# Patient Record
Sex: Male | Born: 1985 | Race: White | Hispanic: No | Smoking: Former smoker
Health system: Southern US, Community
[De-identification: ages and names within clinical notes are randomized; demographics above are authoritative.]

## PROBLEM LIST (undated history)

## (undated) DIAGNOSIS — E119 Type 2 diabetes mellitus without complications: Secondary | ICD-10-CM

## (undated) HISTORY — PX: TONSILLECTOMY: SUR1361

## (undated) HISTORY — PX: SHOULDER SURGERY: SHX246

---
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
## 2004-12-19 ENCOUNTER — Emergency Department (HOSPITAL_COMMUNITY): Admission: EM | Admit: 2004-12-19 | Discharge: 2004-12-19 | Payer: Self-pay | Admitting: Emergency Medicine

## 2005-06-04 ENCOUNTER — Ambulatory Visit: Payer: Self-pay | Admitting: Internal Medicine

## 2007-03-27 ENCOUNTER — Emergency Department (HOSPITAL_COMMUNITY): Admission: EM | Admit: 2007-03-27 | Discharge: 2007-03-27 | Payer: Self-pay | Admitting: Emergency Medicine

## 2007-03-27 ENCOUNTER — Inpatient Hospital Stay (HOSPITAL_COMMUNITY): Admission: AD | Admit: 2007-03-27 | Discharge: 2007-03-31 | Payer: Self-pay | Admitting: Psychiatry

## 2007-03-28 ENCOUNTER — Ambulatory Visit: Payer: Self-pay | Admitting: Psychiatry

## 2007-04-04 ENCOUNTER — Other Ambulatory Visit (HOSPITAL_COMMUNITY): Admission: RE | Admit: 2007-04-04 | Discharge: 2007-07-03 | Payer: Self-pay | Admitting: Psychiatry

## 2011-07-13 ENCOUNTER — Emergency Department: Payer: Self-pay | Admitting: Unknown Physician Specialty

## 2011-10-01 LAB — URINE DRUGS OF ABUSE SCREEN W ALC, ROUTINE (REF LAB)
Amphetamine Screen, Ur: NEGATIVE
Barbiturate Quant, Ur: NEGATIVE
Benzodiazepines.: NEGATIVE
Cocaine Metabolites: NEGATIVE
Creatinine,U: 176.6
Ethyl Alcohol: <5
Marijuana Metabolite: NEGATIVE
Methadone: NEGATIVE
Opiate Screen, Urine: NEGATIVE
Phencyclidine (PCP): NEGATIVE
Propoxyphene: NEGATIVE

## 2012-11-25 LAB — COMPREHENSIVE METABOLIC PANEL
Albumin: 3.5 g/dL (ref 3.4–5.0)
Alkaline Phosphatase: 79 U/L (ref 50–136)
Anion Gap: 9 (ref 7–16)
BUN: 10 mg/dL (ref 7–18)
Bilirubin,Total: 0.6 mg/dL (ref 0.2–1.0)
Calcium, Total: 8.9 mg/dL (ref 8.5–10.1)
Chloride: 100 mmol/L (ref 98–107)
Co2: 25 mmol/L (ref 21–32)
Creatinine: 0.83 mg/dL (ref 0.60–1.30)
EGFR (African American): >60
EGFR (Non-African Amer.): >60
Glucose: 355 mg/dL — ABNORMAL HIGH (ref 65–99)
Osmolality: 282 (ref 275–301)
Potassium: 3.8 mmol/L (ref 3.5–5.1)
SGOT(AST): 29 U/L (ref 15–37)
SGPT (ALT): 58 U/L (ref 12–78)
Sodium: 134 mmol/L — ABNORMAL LOW (ref 136–145)
Total Protein: 7.9 g/dL (ref 6.4–8.2)

## 2012-11-25 LAB — CBC WITH DIFFERENTIAL/PLATELET
Basophil #: 0.1 10*3/uL (ref 0.0–0.1)
Basophil %: 0.8 %
Eosinophil #: 0.2 10*3/uL (ref 0.0–0.7)
Eosinophil %: 1.9 %
HCT: 44.5 % (ref 40.0–52.0)
HGB: 15.6 g/dL (ref 13.0–18.0)
Lymphocyte #: 2.3 10*3/uL (ref 1.0–3.6)
Lymphocyte %: 24.6 %
MCH: 29.7 pg (ref 26.0–34.0)
MCHC: 35 g/dL (ref 32.0–36.0)
MCV: 85 fL (ref 80–100)
Monocyte #: 0.8 x10 3/mm (ref 0.2–1.0)
Monocyte %: 8.5 %
Neutrophil #: 6.1 10*3/uL (ref 1.4–6.5)
Neutrophil %: 64.2 %
Platelet: 221 10*3/uL (ref 150–440)
RBC: 5.23 10*6/uL (ref 4.40–5.90)
RDW: 12.6 % (ref 11.5–14.5)
WBC: 9.5 10*3/uL (ref 3.8–10.6)

## 2012-11-26 ENCOUNTER — Inpatient Hospital Stay: Payer: Self-pay | Admitting: Internal Medicine

## 2012-11-26 LAB — CBC WITH DIFFERENTIAL/PLATELET
Basophil #: 0 10*3/uL (ref 0.0–0.1)
Basophil %: 0.6 %
Eosinophil #: 0.2 10*3/uL (ref 0.0–0.7)
Eosinophil %: 2.1 %
HCT: 43.9 % (ref 40.0–52.0)
HGB: 15.6 g/dL (ref 13.0–18.0)
Lymphocyte #: 2.1 10*3/uL (ref 1.0–3.6)
Lymphocyte %: 24.3 %
MCH: 30.2 pg (ref 26.0–34.0)
MCHC: 35.5 g/dL (ref 32.0–36.0)
MCV: 85 fL (ref 80–100)
Monocyte #: 0.7 x10 3/mm (ref 0.2–1.0)
Monocyte %: 8.1 %
Neutrophil #: 5.5 10*3/uL (ref 1.4–6.5)
Neutrophil %: 64.9 %
Platelet: 216 10*3/uL (ref 150–440)
RBC: 5.17 10*6/uL (ref 4.40–5.90)
RDW: 12.7 % (ref 11.5–14.5)
WBC: 8.5 10*3/uL (ref 3.8–10.6)

## 2012-11-26 LAB — COMPREHENSIVE METABOLIC PANEL
Albumin: 3.2 g/dL — ABNORMAL LOW (ref 3.4–5.0)
Anion Gap: 7 (ref 7–16)
BUN: 9 mg/dL (ref 7–18)
Chloride: 104 mmol/L (ref 98–107)
Co2: 27 mmol/L (ref 21–32)
EGFR (African American): >60
EGFR (Non-African Amer.): >60
SGOT(AST): 32 U/L (ref 15–37)
SGPT (ALT): 58 U/L (ref 12–78)
Sodium: 138 mmol/L (ref 136–145)

## 2012-11-26 LAB — VANCOMYCIN, TROUGH: Vancomycin, Trough: 15 ug/mL (ref 10–20)

## 2012-11-29 LAB — WOUND CULTURE

## 2012-12-01 LAB — CULTURE, BLOOD (SINGLE)

## 2013-04-13 ENCOUNTER — Emergency Department: Payer: Self-pay | Admitting: Emergency Medicine

## 2013-04-15 ENCOUNTER — Emergency Department: Payer: Self-pay | Admitting: Unknown Physician Specialty

## 2013-04-17 ENCOUNTER — Emergency Department: Payer: Self-pay | Admitting: Emergency Medicine

## 2013-04-25 DIAGNOSIS — IMO0001 Reserved for inherently not codable concepts without codable children: Secondary | ICD-10-CM | POA: Insufficient documentation

## 2013-05-03 ENCOUNTER — Other Ambulatory Visit: Payer: Self-pay | Admitting: Nurse Practitioner

## 2013-05-03 LAB — CBC WITH DIFFERENTIAL/PLATELET
Basophil %: 0.7 %
Eosinophil %: 1.6 %
HCT: 42.8 % (ref 40.0–52.0)
HGB: 15 g/dL (ref 13.0–18.0)
Lymphocyte #: 1.6 10*3/uL (ref 1.0–3.6)
MCH: 29.8 pg (ref 26.0–34.0)
MCV: 85 fL (ref 80–100)
Monocyte #: 0.6 x10 3/mm (ref 0.2–1.0)
Platelet: 203 10*3/uL (ref 150–440)
RBC: 5.04 10*6/uL (ref 4.40–5.90)
RDW: 13.2 % (ref 11.5–14.5)

## 2013-05-03 LAB — COMPREHENSIVE METABOLIC PANEL
Albumin: 3.6 g/dL (ref 3.4–5.0)
Anion Gap: 7 (ref 7–16)
BUN: 9 mg/dL (ref 7–18)
Bilirubin,Total: 0.7 mg/dL (ref 0.2–1.0)
Calcium, Total: 8.7 mg/dL (ref 8.5–10.1)
Chloride: 107 mmol/L (ref 98–107)
Creatinine: 0.73 mg/dL (ref 0.60–1.30)
EGFR (Non-African Amer.): >60
Osmolality: 278 (ref 275–301)
Potassium: 3.8 mmol/L (ref 3.5–5.1)
SGOT(AST): 32 U/L (ref 15–37)
SGPT (ALT): 49 U/L (ref 12–78)
Total Protein: 7.5 g/dL (ref 6.4–8.2)

## 2013-05-03 LAB — URINALYSIS, COMPLETE
Bilirubin,UR: NEGATIVE
Blood: NEGATIVE
Ketone: NEGATIVE
Leukocyte Esterase: NEGATIVE
Ph: 6 (ref 4.5–8.0)
RBC,UR: <1 /HPF (ref 0–5)
Squamous Epithelial: NONE SEEN

## 2013-05-03 LAB — LIPID PANEL
HDL Cholesterol: 33 mg/dL — ABNORMAL LOW (ref 40–60)
Triglycerides: 181 mg/dL (ref 0–200)

## 2013-09-16 ENCOUNTER — Emergency Department: Payer: Self-pay | Admitting: Emergency Medicine

## 2013-11-11 ENCOUNTER — Emergency Department: Payer: Self-pay | Admitting: Emergency Medicine

## 2013-12-18 ENCOUNTER — Ambulatory Visit: Payer: Self-pay | Admitting: Orthopedic Surgery

## 2014-02-27 ENCOUNTER — Ambulatory Visit: Payer: Self-pay | Admitting: Orthopedic Surgery

## 2014-03-26 DIAGNOSIS — S43492A Other sprain of left shoulder joint, initial encounter: Secondary | ICD-10-CM | POA: Insufficient documentation

## 2014-03-26 DIAGNOSIS — M25312 Other instability, left shoulder: Secondary | ICD-10-CM | POA: Insufficient documentation

## 2014-05-03 DIAGNOSIS — E785 Hyperlipidemia, unspecified: Secondary | ICD-10-CM | POA: Insufficient documentation

## 2014-05-03 DIAGNOSIS — R0683 Snoring: Secondary | ICD-10-CM | POA: Insufficient documentation

## 2014-05-21 DIAGNOSIS — Z9889 Other specified postprocedural states: Secondary | ICD-10-CM | POA: Insufficient documentation

## 2015-04-02 NOTE — H&P (Signed)
PATIENT NAME:  Orlena SheldonDREWS, Terald MR#:  [id number] DATE OF BIRTH:  1986/09/16  DATE OF ADMISSION:  11/25/2012  REFERRING PHYSICIAN: Dr. Bayard Malesandolph Brown.  PRIMARY CARE PHYSICIAN: None.   HISTORY OF PRESENT ILLNESS: This is a 29 year old male without significant past medical history who presents with complaints of left abdominal wall cellulitis. The patient reports he has been having this for the last two weeks, but reports over the last week it has been draining purulent discharge. As well, he reports multiple recurrent boils, some of them developed cellulitis and small abscess and resolved spontaneously, mainly in the right axillary region as well. There is a new boil in the right abdominal wall. In the ED the patient was afebrile, did not have any leukocytosis, but the patient complained of chills at home and significant pain. The patient had CT of abdomen and pelvis with IV contrast which did not show any evidence of abscess or fluid collection, but it did show some stranding, subcutaneous fat as well, developing phlegmon in the right abdominal wall. As well, the patient had significantly elevated blood glucose at 355. The patient reports he was diagnosed in the past with diabetes mellitus, for which he was on some oral medications which he stopped a few years ago because he thought he was treated and he does not need to take them anymore. Hospitalist service was requested to admit the patient for further management of his diabetes and cellulitic infection.   PAST MEDICAL HISTORY: Diabetes mellitus.   PAST SURGICAL HISTORY: None.   SOCIAL HISTORY: Denies smoking, drinking or illicit drug use.   FAMILY HISTORY: Significant for diabetes mellitus in the family, mainly his father and grandfather.   ALLERGIES: Codeine.   HOME MEDICATIONS: None.   REVIEW OF SYSTEMS: The patient denies any weight loss or weight gain but complains of fatigue, weakness, and chills. EYES: Denies blurry vision, double vision or  pain. ENT: Denies tinnitus, ear pain or hearing loss. RESPIRATORY: Denies cough, wheezing, hemoptysis. CARDIOVASCULAR: Denies chest pain, orthopnea, edema, arrhythmia, or palpitations. GASTROINTESTINAL: Denies nausea, vomiting, diarrhea, abdominal pain, hematemesis, melena or gastroesophageal reflux disease. GENITOURINARY: Denies dysuria, hematuria, renal colic. ENDOCRINE: Denies polyuria, polydipsia, heat or cold intolerance. HEMATOLOGY: Denies anemia, easy bruising, bleeding diathesis. INTEGUMENTARY: Has complaints of recurrent skin boils with drainage, mainly in the axillary area. MUSCULOSKELETAL: Denies neck pain, shoulder pain, knee pain, arthritis, cramps, gout. NEUROLOGIC: Denies numbness, dysarthria, epilepsy, tremors, vertigo, ataxia. PSYCHIATRIC: Denies anxiety, insomnia, schizophrenia, nervousness, bipolar disorder.   PHYSICAL EXAMINATION:  VITAL SIGNS: Temperature 97.7, pulse 88, respiratory rate 18, blood pressure 134/85, pulse oximetry 94% on room air.   GENERAL: Morbidly obese male who looks comfortable in bed in no apparent distress.   HEENT: Head normocephalic. Pupils equal, reactive to light. Pink conjunctivae. Anicteric sclerae. Moist oral mucosa.   NECK: Supple. No thyromegaly. No JVD.   CHEST: Good air entry bilaterally. No wheezing, rales or rhonchi.   CARDIOVASCULAR: S1, S2 heard. No rubs, murmur, or gallops.   ABDOMEN: Soft, nontender, nondistended. Bowel sounds present.   EXTREMITIES: No edema. No clubbing, no cyanosis.   PSYCHIATRIC: Appropriate affect. Awake, alert x3. Intact judgment and insight.   NEUROLOGIC: Cranial nerves grossly intact to 5/5.   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   LABORATORY, RADIOLOGICAL AND DIAGNOSTIC DATA: Glucose 355, BUN 10, creatinine 0.83,  sodium 134, potassium 3.8, chloride 100,  CO2 25. White blood cells 9.5, hemoglobin 15.6, hematocrit 44.5 and platelets 221. CT of abdomen and pelvis showing focal inflammatory stranding in the subcutaneous fat along the left anterior, lateral abdominal wall which might be infectious or inflammatory. No focal fluid collection is seen and right abdominal wall focal inflammatory stranding which may present infectious or inflammatory origin, possible evolving phlegmon.   ASSESSMENT AND PLAN: 29 year old male who presents with abdominal wall cellulitis with purulent discharge, most likely it was abscess which was spontaneously drained. As well, the patient has history of multiple recurrent skin boils. CT of abdomen does not show any fluid collection.  1. Abdominal wall cellulitis. CT of abdomen does not show any fluid collection. We will start the patient on Vancomycin and Zosyn. We will follow on blood cultures and wound culture. 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 Will consult surgical service for possible need of debridement as the skin looks deep with continuous purulent discharge.  2. Diabetes mellitus, uncontrolled, as the patient was off his medication for a few years. Will be started on insulin sliding scale. We will avoid metformin as he recently received IV contrast.  3. CODE STATUS: FULL CODE.  4. Deep vein thrombosis prophylaxis. Subcutaneous heparin.   TOTAL TIME SPENT ON ADMISSION AND PATIENT CARE: 45 minutes.   ____________________________ Starleen Arms, MD dse:ap D: 11/25/2012 08:39:46 ET T: 11/25/2012 09:04:11 ET JOB#: [id number]  cc: Starleen Arms, MD, <Dictator> DAWOOD Teena Irani MD ELECTRONICALLY SIGNED 11/26/2012 1:48

## 2015-04-02 NOTE — Discharge Summary (Signed)
PATIENT NAME:  Angel SheldonDREWS, Yiannis MR#:  [id number] DATE OF BIRTH:  [birth year]  DATE OF ADMISSION:  11/26/2012 DATE OF DISCHARGE:  11/28/2012  PRIMARY CARE PHYSICIAN:  None.   DISCHARGE DIAGNOSES:  1.  Abdominal wall cellulitis secondary to Staphylococcus.  2.  De novo diabetes mellitus.   CONSULTS:  Dr. Excell Seltzerooper of Surgery.   PROCEDURES:  None.   IMAGING STUDIES:  CT scan of the abdomen and pelvis with contrast showed cellulitis in the abdominal wall with no abscess.   ADMITTING HISTORY AND PHYSICAL:  Please see detailed H and P dictated on 11/25/2012. In brief, a 29 year old obese male patient with prior history of folliculitis on the abdominal wall presented with left abdominal wall cellulitis. The patient had pus draining from his wound, also had fever at home with elevated blood sugars of 355 with new diagnosis of diabetes, and was admitted to the hospitalist service for further workup and treatment.   HOSPITAL COURSE:  1.  Abdominal wall cellulitis. The patient had a CT scan of the abdomen which showed no abscess. Surgery was consulted who suggested no I and D as the patient had a widely open wound which drained with minimal purulent discharge. The patient was started on broad-spectrum antibiotics of vancomycin and Zosyn with which he responded well. The wound cultures have grown Staph aureus which is sensitive to ciprofloxacin which the patient has been transitioned to orally and is being discharged home to follow up with Dr. Excell Seltzerooper of Surgery.  2.  Diabetes. For diabetes, the patient has been started on insulin 70/30 of 10 units b.i.d. and has been given supplies for glucometer, Accustrips and also syringes. [facility]. He has been given lifestyle education regarding diabetes while in the hospital.   DISCHARGE CONDITION:  On the day of discharge, the patient is afebrile. His abdominal wall wound looks significantly better with minimal  erythema around it and is being discharged home in a fair condition.   DISCHARGE MEDICATIONS:  Include: 1.  Metformin 850 mg oral twice a day.  2.  Insulin 70/30 subcutaneous, 10 units twice a day.  3.  Ciprofloxacin 500 mg oral twice a day for 10 days.   DISCHARGE INSTRUCTIONS:  The patient will be on a diabetic diet. Activity as tolerated. Followup with Surgery, Dr. Excell Seltzerooper. He is to call his doctor or return to Emergency Room if he has any fever or worsening of symptoms.   Time spent _____ on this discharge dictation along with coordinating and counseling of the patient was 40 minutes.    ____________________________ Molinda BailiffSrikar R. Zamora Colton, MD srs:es D: 11/28/2012 15:17:35 ET T: 11/29/2012 13:24:26 ET JOB#: [id number]  cc: Wardell HeathSrikar R. Gahel Safley, MD, <Dictator> Orie FishermanSRIKAR R Kaimani Clayson MD ELECTRONICALLY SIGNED 11/29/2012 17:10

## 2015-04-06 NOTE — Op Note (Signed)
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   PROCEDURE: Left shoulder arthroscopy.   SURGEON: Murlean HarkShalini Freddi Forster, M.D.   ASSISTANT: Devota PaceApril Berndt, NP  ANESTHESIA: Interscalene nerve block and general anesthesia.   COMPLICATIONS: Unfortunately, after many attempts to maintain proper visualization in order to perform labral repair, the patient's body habitus was prohibitive of this. With shortest sleeve applied to arthroscope, the joint was unable to be visualized from the posterior aspect, as the camera and sleeve were maximally inserted and unable to fully penetrate into the glenohumeral joint. Therefore, procedure was truncated before repair was possible.   INDICATIONS FOR PROCEDURE: Mr. Angel Chandler is a 29 year old gentleman who had previously been a Stage managerprofessional wrestler. He sustained an injury to the left shoulder resulting in an anterior dislocation. He states that since the time of that trauma, he had had multiple episodes of dislocation without any associated trauma. He explains that he had had many small prior injuries to his shoulder from his wrestling career.   MR arthrogram confirmed a 360-degree labral tear. The decision was made to attempt arthroscopic labral repair. Risks and benefits were explained to the patient in the office. We did explain to the patient that, with his large body habitus, we may be somewhat limited in what equipment would be able to be used. The patient demonstrated full understanding.   DESCRIPTION OF PROCEDURE: The patient was identified in the preoperative holding area. Left upper extremity was marked as the operative site. Interscalene block was administered. The patient was brought into the operating room and placed on the table in a lateral  decubitus position after general anesthesia was administered. Axillary roll was placed. Appropriate padding was placed. The patient was secured on the bed with multiple safety straps and a beanbag. The left upper extremity was placed in a traction set-up to distract the joint. Left upper extremity was prepared and draped in the usual sterile fashion. Landmarks were palpated. A surgical timeout was performed, identifying the patient, procedure, laterality, imaging studies, confirmation of skin preparation, confirmation of preoperative antibiotics, and confirmation of consent form.   Incision for a standard lateral viewing portal was made. Trocar and arthroscope sleeve were inserted. With the sleeve maximally inserted, we were unable to palpably feel entry into the glenohumeral joint. A longer trocar was inserted through the existing sleeve and penetration into the joint was palpably felt. The arm was moved in order to move the humeral head to confirm that we were in the glenohumeral joint.   Trocar was removed and arthroscope was inserted. The joint was very briefly visualized; however, the arthroscope was unable to be advanced completely into the glenohumeral space. A second, more medial, incision was made in order to bypass some of the excessive soft tissue. Unfortunately, on the second incision, similarly, we were unable to make the arthroscope reach into the glenohumeral joint.   The decision was now made to try from the anterior aspect. The coracoid was palpated, and just medial to the coracoid, an incision was made. Arthroscope was able be inserted into the glenohumeral joint through the anterior access portal. The joint was found to have significant tissue fraying. The labrum was found to have extensive degeneration throughout. The glenohumeral joint was noted to have significant degenerative changes. The most pronounced portion of the labral tear was  anterior, and the patient's dislocations were  anterior-inferior.  The decision was made that the most important portion to repair would be the anterior labrum if only   limited repair would be possible with our limited visibility. Attempt was made to insert shaver through the posterior portal; however, through a cannula, the shaver maximally hubbed out without being able to reach the anterior glenoid.   At this time, another attempt was made to achieve access through the posterior aspect. A switching stick was taken from the anterior portal directly through the glenohumeral joint posteriorly and an incision was made directly over the exit point. Arthroscope was again attempted to be inserted; however, once again could not reach into the glenohumeral joint.   At this time, the decision was made to truncate the procedure as the patient's body habitus with a weight of 389 pounds would not allow adequate visualization or performance of labral repair. All instruments were removed. Portals were closed. Sterile dressings were applied.   An extensive discussion was had with the patient and his family regarding our limitations with our shoulder arthroscopy equipment, and given the patient's significant body habitus, we will arrange for the patient to be seen by Dr. Glendell Chandler at [facility]. Dr. Dorita Sciara was contacted and is aware of the complications that we ran into during this procedure. He will see if he will be able to gain access to longer hip arthroscopy equipment potentially to perform the surgery. He will see Angel Chandler in consultation next month.    ____________________________ Murlean Hark, MD sr:np D: 03/05/2014 16:36:02 ET T: 03/05/2014 22:06:52 ET JOB#: [id number]  cc: Murlean Hark, MD, <Dictator> Murlean Hark MD ELECTRONICALLY SIGNED 03/21/2014 11:24

## 2015-10-11 IMAGING — CR DG SHOULDER 3+V*L*
1 series · 2 of 2 positions shown · non-contrast
Comparison: None.

CLINICAL DATA: Left shoulder pain, decreased range of motion

EXAM:
DG SHOULDER 3+VIEWS LEFT

[Series 1: w shoulder grashey left · 0.14mm/px · 2 of 2 slices shown]
[im 1/2]
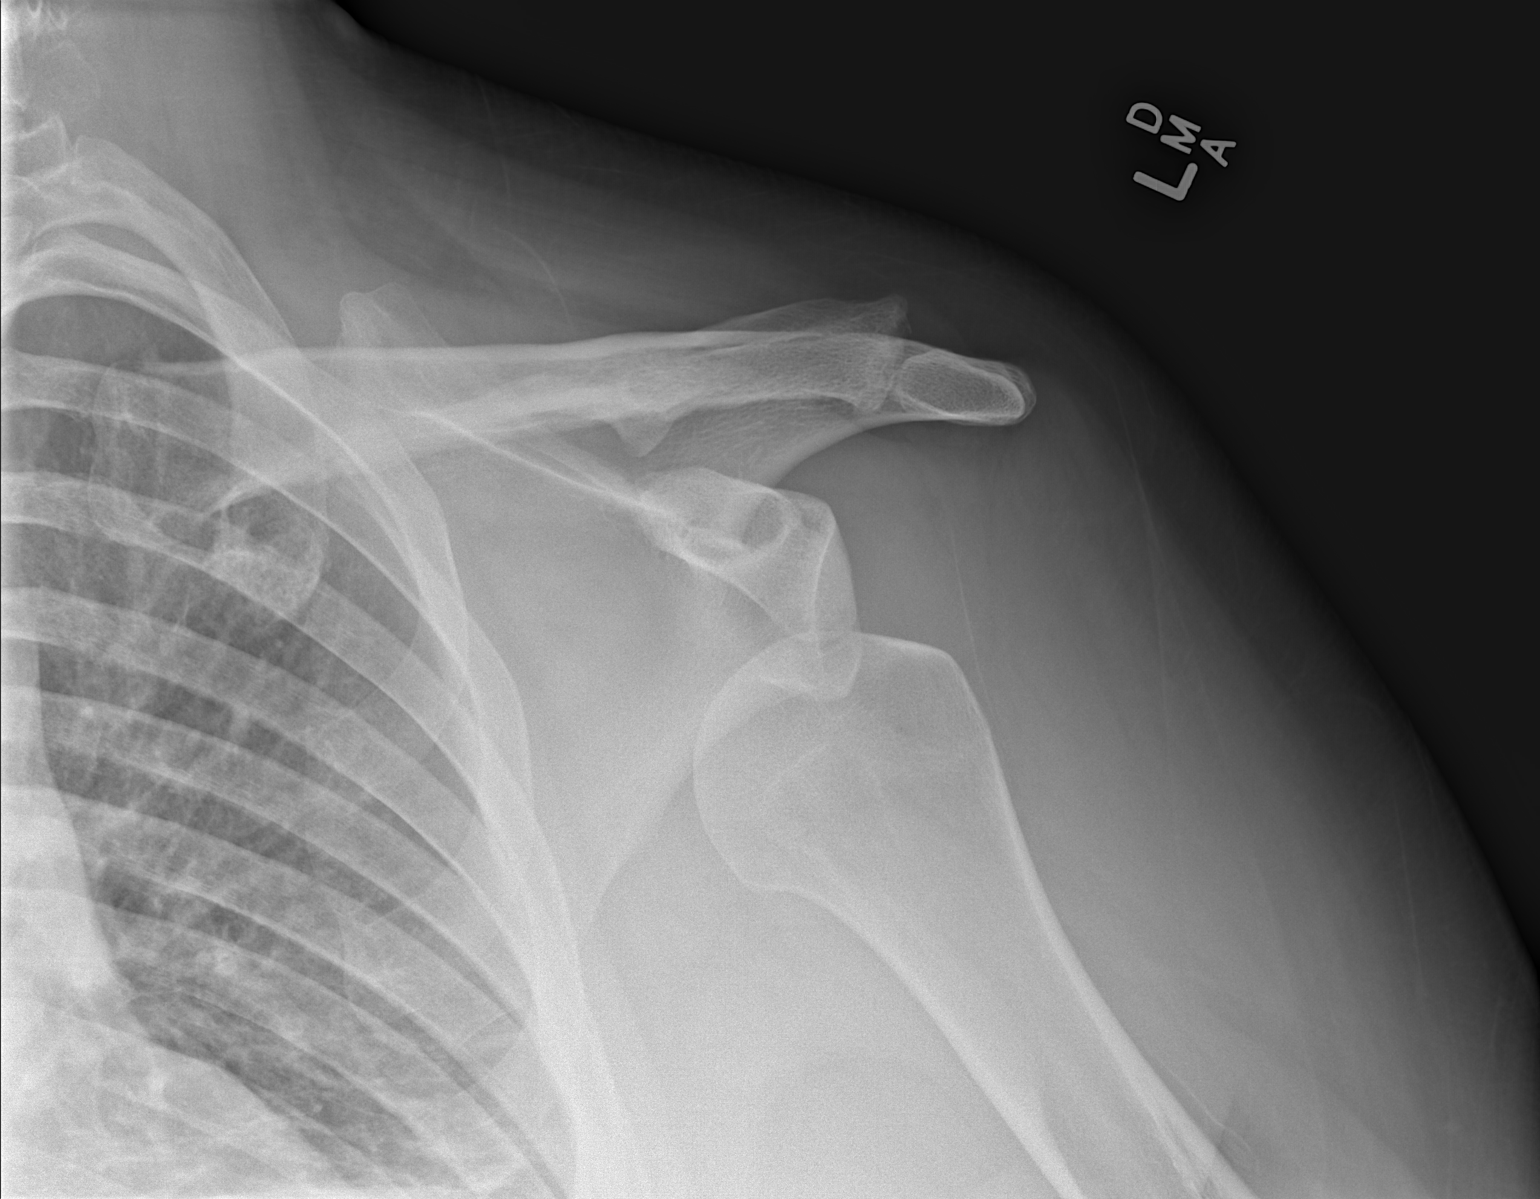
[im 2/2]
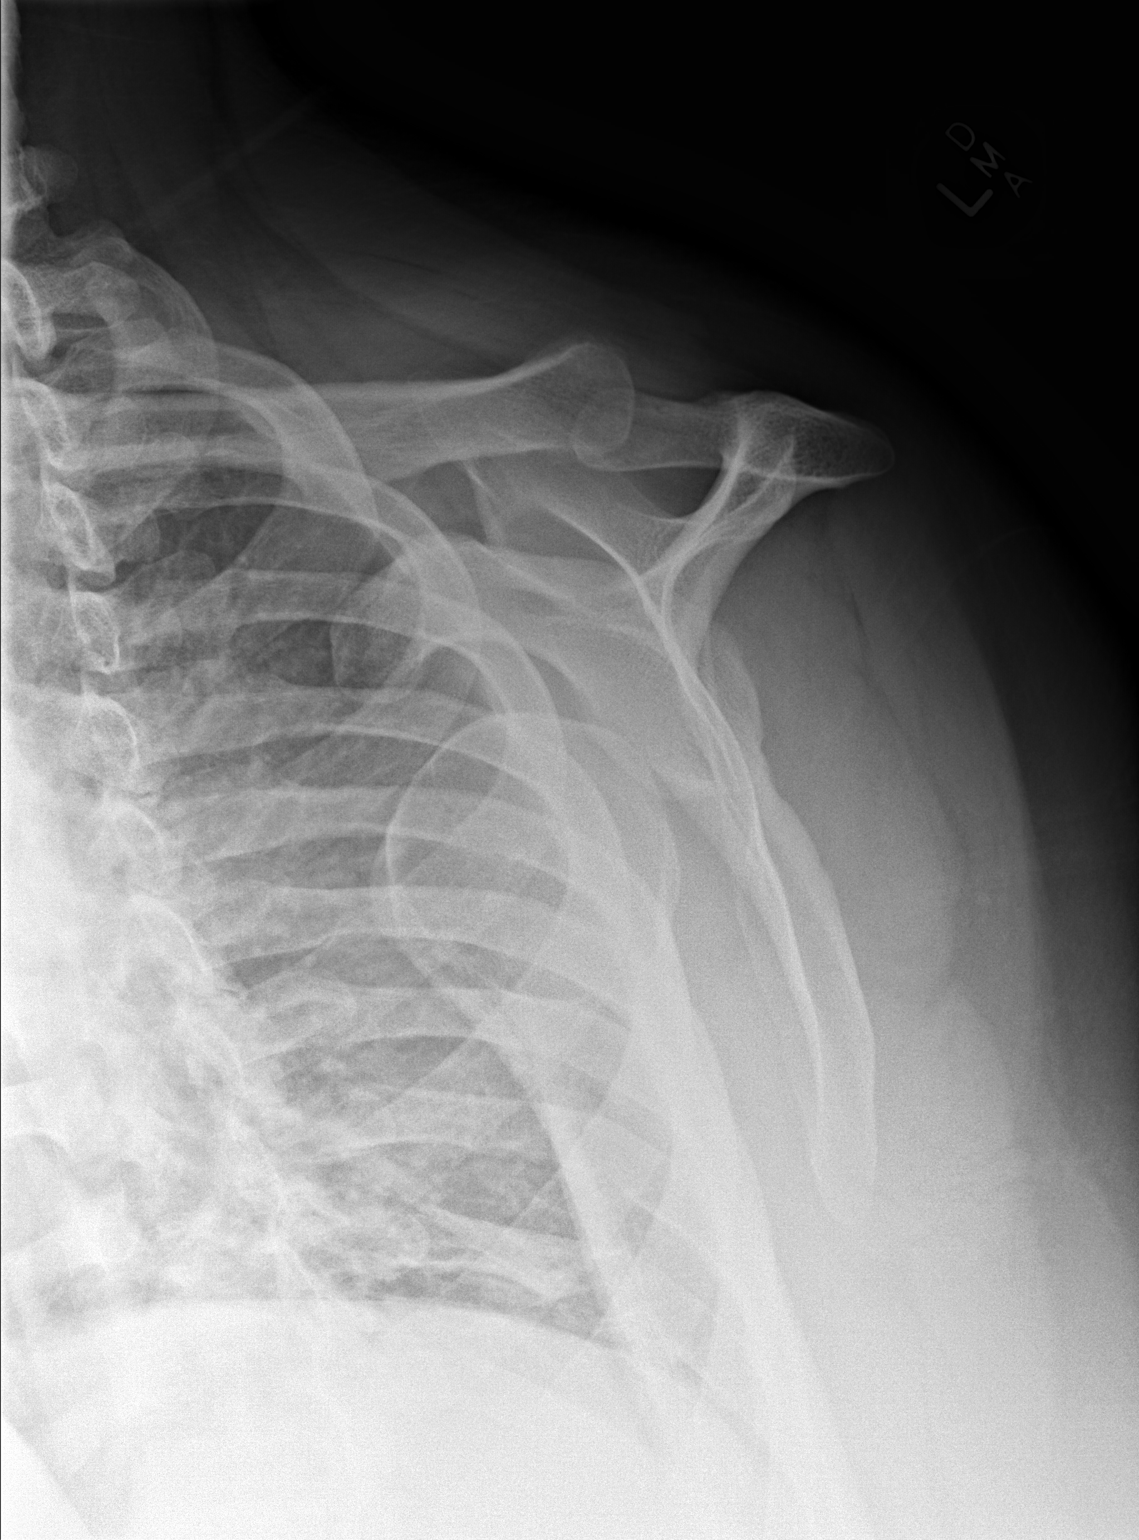

[2 of 2 positions shown; findings below may reference images not displayed]

FINDINGS: Anterior inferior shoulder dislocation.

No fracture is seen.

Mild degenerative changes of the acromioclavicular joint.

Visualized left lung is clear.
IMPRESSION: Anterior shoulder dislocation.

## 2018-10-04 ENCOUNTER — Ambulatory Visit
Admission: RE | Admit: 2018-10-04 | Discharge: 2018-10-04 | Disposition: A | Discharge status: Home / Self Care | Payer: Medicaid Other | Source: Ambulatory Visit | Attending: Family Medicine | Admitting: Family Medicine

## 2018-10-04 ENCOUNTER — Other Ambulatory Visit: Payer: Self-pay | Admitting: Family Medicine

## 2018-10-04 DIAGNOSIS — M25512 Pain in left shoulder: Secondary | ICD-10-CM | POA: Diagnosis present

## 2020-03-01 ENCOUNTER — Ambulatory Visit: Payer: Medicaid Other | Attending: Internal Medicine

## 2020-03-01 DIAGNOSIS — Z23 Encounter for immunization: Secondary | ICD-10-CM

## 2020-03-01 NOTE — Progress Notes (Signed)
° °  Covid-19 Vaccination Clinic  Name:  Angel Chandler    MRN: [id number] DOB: 1986/07/26  03/01/2020  Angel Chandler was observed post Covid-19 immunization for 15 minutes without incident. He was provided with Vaccine Information Sheet and instruction to access the V-Safe system.   Angel Chandler was instructed to call 911 with any severe reactions post vaccine:  Difficulty breathing   Swelling of face and throat   A fast heartbeat   A bad rash all over body   Dizziness and weakness   Immunizations Administered    Name Date Dose VIS Date Route   Pfizer COVID-19 Vaccine 03/01/2020  1:47 PM 0.3 mL 11/24/2019 Intramuscular   Manufacturer: [employer], [employer]   Lot: [id number]   NDC: [address]-5

## 2020-03-27 ENCOUNTER — Ambulatory Visit: Payer: Medicaid Other | Attending: Internal Medicine

## 2020-03-27 DIAGNOSIS — Z23 Encounter for immunization: Secondary | ICD-10-CM

## 2020-03-27 NOTE — Progress Notes (Signed)
   Covid-19 Vaccination Clinic  Name:  Angel Chandler    MRN: [id number] DOB: July 17, 1986  03/27/2020  [name] was observed post Covid-19 immunization for 15 minutes without incident. He was provided with Vaccine Information Sheet and instruction to access the V-Safe system.   [name] was instructed to call 911 with any severe reactions post vaccine: Marland Kitchen Difficulty breathing  . Swelling of face and throat  . A fast heartbeat  . A bad rash all over body  . Dizziness and weakness   Immunizations Administered    Name Date Dose VIS Date Route   Pfizer COVID-19 Vaccine 03/27/2020  9:06 AM 0.3 mL 11/24/2019 Intramuscular   Manufacturer: [employer], [employer]   Lot: [id number]   NDC: [address]-7

## 2020-05-08 ENCOUNTER — Ambulatory Visit: Payer: Medicaid Other | Admitting: Podiatry

## 2020-05-21 ENCOUNTER — Other Ambulatory Visit: Payer: Self-pay

## 2020-05-21 ENCOUNTER — Ambulatory Visit: Payer: Medicaid Other | Admitting: Podiatry

## 2020-05-21 ENCOUNTER — Encounter: Payer: Self-pay | Admitting: Podiatry

## 2020-05-21 DIAGNOSIS — L84 Corns and callosities: Secondary | ICD-10-CM

## 2020-05-21 DIAGNOSIS — E0843 Diabetes mellitus due to underlying condition with diabetic autonomic (poly)neuropathy: Secondary | ICD-10-CM | POA: Diagnosis not present

## 2020-05-21 DIAGNOSIS — M722 Plantar fascial fibromatosis: Secondary | ICD-10-CM

## 2020-05-21 DIAGNOSIS — L989 Disorder of the skin and subcutaneous tissue, unspecified: Secondary | ICD-10-CM

## 2020-05-21 MED ORDER — MELOXICAM 15 MG PO TABS: 15.0000 mg | ORAL_TABLET | Freq: Every day | ORAL | 0 refills | Status: AC

## 2020-05-21 NOTE — Progress Notes (Signed)
   Subjective: 34 y.o. male presenting today as a new patient past medical history diabetes mellitus for the past 6 years presents today as a referral from his PCP for evaluation of calluses to the bilateral feet.  Patient does not complain of pain associated to the calluses however they are very thick to the bilateral great toes and the right heel.  He has not done anything for treatment.   Patient also states that for the last 6 months he has had some pain and tenderness to the left heel.  He recalls injuring it approximately 6 months ago, however over the past 6 months he has noticed significant improvement.  He presents today for further treatment evaluation  No past medical history on file.   Objective:  Physical Exam General: Alert and oriented x3 in no acute distress  Dermatology: Hyperkeratotic lesion(s) present on the bilateral great toes and right heel.  Negative for pain on palpation.  Skin is warm, dry and supple bilateral lower extremities. Negative for open lesions or macerations.  Vascular: Palpable pedal pulses bilaterally. No edema or erythema noted. Capillary refill within normal limits.  Neurological: Epicritic and protective threshold diminished bilaterally.   Musculoskeletal Exam: Range of motion within normal limits bilateral. Muscle strength 5/5 in all groups bilateral.  There is some pain on palpation noted to the medial calcaneal tubercle left heel  Assessment: #1 Diabetes mellitus w/ peripheral neuropathy #2  Preulcerative callus lesions noted to the bilateral great toes and right heel #3 plantar fasciitis left foot  Plan of Care:  #1 Patient evaluated #2 Excisional debridement of keratotic lesion(s) using a chisel blade was performed without incident.  #3  Patient explained that he walks barefoot around the house.  I recommended to the patient that he wears good supportive tennis shoes even around the house.  Do not recommend sandals or going barefoot at all.  I  explained this may be a lifestyle adjustment that he should make to always have good supportive tennis shoes on his feet when he is ambulating #4 prescription for meloxicam to take daily for the mild plantar fasciitis.  Patient states he has noticed improvement over the last 6 months #5 otherwise no complications associated with his feet.  Return to clinic as needed   Felecia Shelling, DPM [facility]  Dr. Felecia Shelling, DPM    [address]                                        [place], [place] [address]                Office [phone]  Fax [phone]

## 2021-02-05 ENCOUNTER — Other Ambulatory Visit: Payer: Self-pay

## 2021-02-05 ENCOUNTER — Encounter: Payer: Self-pay | Admitting: Emergency Medicine

## 2021-02-05 ENCOUNTER — Emergency Department: Payer: Medicaid Other

## 2021-02-05 DIAGNOSIS — S91111A Laceration without foreign body of right great toe without damage to nail, initial encounter: Secondary | ICD-10-CM | POA: Diagnosis not present

## 2021-02-05 DIAGNOSIS — W2203XA Walked into furniture, initial encounter: Secondary | ICD-10-CM | POA: Insufficient documentation

## 2021-02-05 DIAGNOSIS — Z7984 Long term (current) use of oral hypoglycemic drugs: Secondary | ICD-10-CM | POA: Diagnosis not present

## 2021-02-05 DIAGNOSIS — Y999 Unspecified external cause status: Secondary | ICD-10-CM | POA: Insufficient documentation

## 2021-02-05 DIAGNOSIS — Y92008 Other place in unspecified non-institutional (private) residence as the place of occurrence of the external cause: Secondary | ICD-10-CM | POA: Insufficient documentation

## 2021-02-05 DIAGNOSIS — E119 Type 2 diabetes mellitus without complications: Secondary | ICD-10-CM | POA: Insufficient documentation

## 2021-02-05 DIAGNOSIS — Z87891 Personal history of nicotine dependence: Secondary | ICD-10-CM | POA: Diagnosis not present

## 2021-02-05 DIAGNOSIS — Y9389 Activity, other specified: Secondary | ICD-10-CM | POA: Diagnosis not present

## 2021-02-05 NOTE — ED Triage Notes (Signed)
Pt to ED from home c/o right big toe injury tonight after kicking a couch.  Pt ambulatory, laceration to medial great toe, new wrap in place in triage.

## 2021-02-06 ENCOUNTER — Emergency Department
Admission: EM | Admit: 2021-02-06 | Discharge: 2021-02-06 | Disposition: A | Discharge status: Home / Self Care | Payer: Medicaid Other | Attending: Emergency Medicine | Admitting: Emergency Medicine

## 2021-02-06 DIAGNOSIS — S91111A Laceration without foreign body of right great toe without damage to nail, initial encounter: Secondary | ICD-10-CM

## 2021-02-06 DIAGNOSIS — S99921A Unspecified injury of right foot, initial encounter: Secondary | ICD-10-CM

## 2021-02-06 HISTORY — DX: Type 2 diabetes mellitus without complications: E11.9

## 2021-02-06 MED ORDER — NAPROXEN 500 MG PO TABS
500.0000 mg | ORAL_TABLET | Freq: Once | ORAL | Status: AC
  Administered 2021-02-06: 500 mg via ORAL
  Filled 2021-02-06: qty 1

## 2021-02-06 MED ORDER — ACETAMINOPHEN 500 MG PO TABS
1000.0000 mg | ORAL_TABLET | Freq: Once | ORAL | Status: AC
  Administered 2021-02-06: 1000 mg via ORAL
  Filled 2021-02-06: qty 2

## 2021-02-06 MED ORDER — BACITRACIN ZINC 500 UNIT/GM EX OINT
TOPICAL_OINTMENT | Freq: Once | CUTANEOUS | Status: AC
  Filled 2021-02-06: qty 0.9

## 2021-02-06 MED ORDER — LIDOCAINE HCL (PF) 1 % IJ SOLN
10.0000 mL | Freq: Once | INTRAMUSCULAR | Status: AC
  Administered 2021-02-06: 10 mL
  Filled 2021-02-06: qty 10

## 2021-02-06 NOTE — Discharge Instructions (Signed)
Please take Tylenol and ibuprofen/Advil for your pain.  It is safe to take them together, or to alternate them every few hours.  Take up to 1000mg  of Tylenol at a time, up to 4 times per day.  Do not take more than 4000 mg of Tylenol in 24 hours.  For ibuprofen, take 400-600 mg, 4-5 times per day.  In general, keep the area clean and dry. Then apply a Bacitracin/Neosporin type of antibiotic ointment.  Gently wash with warm soap and water once daily, and again if it gets dirty. Don't vigorously scrub at the wound.  Gently pat dry. Once dry, apply Neosporin / bacitracin type antibiotic ointment. It is important to keep the wound moist with an antibiotic ointment, or a small amount of Vaseline, for the first 5 days. Keeping it moist/clean will help the area heal faster and prevent scar tissue formation.  If you are doing anything active, keep it covered.  If you are sitting around at home, you may leave it uncovered.  We placed 9 stitches that will not absorb on their own. The stitches will need to be removed in about 10-14 days.

## 2021-02-06 NOTE — ED Provider Notes (Signed)
[facility] Emergency Department Provider Note ____________________________________________   Event Date/Time   First MD Initiated Contact with Patient 02/06/21 0031     (approximate)  I have reviewed the triage vital signs and the nursing notes.  HISTORY  Chief Complaint Foot Injury   HPI Angel Chandler is a 35 y.o. malewho presents to the ED for evaluation of toe injury.   Chart review indicates hx DM and obesity.   Patient presents to the ED with his fiance, who provides additional history, for evaluation of right great toe injury after accidentally kicking his couch at home. He reports walking by his couch, accidentally driving his right-sided great toe into the furniture causing injury. He denies falls, syncope, head trauma or additional trauma beyond his right great toe. Currently reporting 6/10 intensity aching pain that is constant and nonradiating. Has not taken any medications prior to arrival.   Past Medical History:  Diagnosis Date  . Diabetes mellitus without complication (HCC)    Type 2    Patient Active Problem List   Diagnosis Date Noted  . S/P arthroscopy of shoulder 05/21/2014  . Hyperlipidemia 05/03/2014  . Snoring 05/03/2014  . 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 Instability of left shoulder joint 03/26/2014  . Elevated BP 04/25/2013  . Abscess 04/21/2013  . DM type 2 (diabetes mellitus, type 2) (HCC) 04/21/2013  . Encounter to establish care 04/21/2013    Past Surgical History:  Procedure Laterality Date  . SHOULDER SURGERY Left   . TONSILLECTOMY      Prior to Admission medications   Medication Sig Start Date End Date Taking? Authorizing Provider  acetaminophen (TYLENOL) 500 MG tablet Take by mouth.    [provider]  buPROPion (WELLBUTRIN XL) 300 MG 24 hr tablet Take by mouth. 10/02/17   [provider]  canagliflozin (INVOKANA) 300 MG TABS tablet Take by mouth. 03/30/14   [provider]  Docusate Sodium (DSS) 100 MG CAPS Take by mouth. 05/11/14   [provider]  HYDROcodone-acetaminophen (NORCO/VICODIN) 5-325 MG tablet Take 1-2 tabs by mouth every 4-6 hours as needed 06/07/14   [provider]  ibuprofen (ADVIL) 600 MG tablet Take by mouth.    [provider]  lisinopril (ZESTRIL) 5 MG tablet Take by mouth. 10/02/17   [provider]  meloxicam (MOBIC) 15 MG tablet Take 1 tablet (15 mg total) by mouth daily. 05/21/20   Felecia Shelling, DPM  oxyCODONE (OXY IR/ROXICODONE) 5 MG immediate release tablet Take by mouth. 05/11/14   [provider]  pravastatin (PRAVACHOL) 40 MG tablet Take by mouth.    [provider]  sitaGLIPtin-metformin (JANUMET) 50-1000 MG tablet Take by mouth.    [provider]  traMADol (ULTRAM) 50 MG tablet One tablet by mouth every 4-6 hours as needed for pain. 06/25/14   [provider]    Allergies Codeine  History reviewed. No pertinent family history.  Social History Social History   Tobacco Use  . Smoking status: Former Games developer  . Smokeless tobacco: Former Engineer, water Use Topics  . Alcohol use: Never  . Drug use: Never    Review of Systems  Constitutional: No fever/chills Eyes: No visual changes. ENT: No sore throat. Cardiovascular: Denies chest pain. Respiratory: Denies shortness of breath. Gastrointestinal: No abdominal pain.  No nausea, no vomiting.  No diarrhea.  No constipation. Genitourinary: Negative for dysuria. Musculoskeletal: Negative for back pain. Positive for right great toe pain and  injury. Skin: Negative for rash. Neurological: Negative for headaches, focal weakness or numbness. ____________________________________________   PHYSICAL EXAM:  VITAL SIGNS: Vitals:   02/05/21 2325  BP: (!) 148/89  Pulse: 80  Resp: 14  Temp: 98 F (36.7 C)  SpO2: 95%     Constitutional: Alert and oriented. Well appearing and in no acute  distress. Obese. Pleasant and conversational in full sentences. Eyes: Conjunctivae are normal. PERRL. EOMI. Head: Atraumatic. Nose: No congestion/rhinnorhea. Mouth/Throat: Mucous membranes are moist.  Oropharynx non-erythematous. Neck: No stridor. No cervical spine tenderness to palpation. Cardiovascular: Normal rate, regular rhythm. Grossly normal heart sounds.  Good peripheral circulation. Respiratory: Normal respiratory effort.  No retractions. Lungs CTAB. Gastrointestinal: Soft , nondistended, nontender to palpation. No CVA tenderness. Musculoskeletal:  Hemostatic laceration to the plantar aspect of the base of the right great toe. Curved/semilunate in shape, into the subcutaneous tissue, overlying the plantar aspect of the proximal phalanx of the right great toe. No nail injury or disruption. No subungual hematoma. Remainder of the right foot and other 4 toes on the right foot are nontender without signs of deformity, tenderness or trauma. Palpation of all 4 extremities otherwise without evidence of deformity, trauma or tenderness. Neurologic:  Normal speech and language. No gross focal neurologic deficits are appreciated. No gait instability noted. Skin:  Skin is warm, dry and intact. No rash noted. Psychiatric: Mood and affect are normal. Speech and behavior are normal. ____________________________________________  RADIOLOGY  ED MD interpretation: Plain film of the right foot reviewed by me without evidence of acute bony injury.  Official radiology report(s): DG Foot Complete Right  Result Date: 02/05/2021 CLINICAL DATA:  Foot injury laceration EXAM: RIGHT FOOT COMPLETE - 3+ VIEW COMPARISON:  None. FINDINGS: No fracture or malalignment. Minimal degenerative change at the first MTP joint. There are vascular calcifications. IMPRESSION: No acute osseous abnormality. Electronically Signed   By: Jasmine Pang M.D.   On: 02/05/2021 23:57    ____________________________________________   PROCEDURES and INTERVENTIONS  Procedure(s) performed (including Critical Care):  Marland KitchenMarland KitchenLaceration Repair  Date/Time: 02/06/2021 4:39 AM Performed by: Delton Prairie, MD Authorized by: Delton Prairie, MD   Consent:    Consent obtained:  Verbal   Consent given by:  Patient and spouse   Risks discussed:  Infection, pain and poor wound healing   Alternatives discussed:  No treatment Anesthesia:    Anesthesia method:  Local infiltration   Local anesthetic:  Lidocaine 1% w/o epi Laceration details:    Location:  Toe   Toe location:  R big toe   Length (cm):  6 Pre-procedure details:    Preparation:  Patient was prepped and draped in usual sterile fashion and imaging obtained to evaluate for foreign bodies Exploration:    Limited defect created (wound extended): no     Hemostasis achieved with:  Direct pressure   Imaging obtained: x-ray     Imaging outcome: foreign body not noted     Contaminated: no   Treatment:    Area cleansed with:  Povidone-iodine   Amount of cleaning:  Extensive   Irrigation solution:  Sterile saline   Irrigation volume:  500   Irrigation method:  Pressure wash   Visualized foreign bodies/material removed: no     Debridement:  None Skin repair:    Repair method:  Sutures   Suture size:  3-0   Suture material:  Nylon   Suture technique:  Simple interrupted   Number of sutures:  9 Approximation:    Approximation:  Close Post-procedure details:    Dressing:  Antibiotic ointment, splint for protection and sterile dressing   Procedure completion:  Tolerated well, no immediate complications    Medications  acetaminophen (TYLENOL) tablet 1,000 mg (1,000 mg Oral Given 02/06/21 0053)  naproxen (NAPROSYN) tablet 500 mg (500 mg Oral Given 02/06/21 0053)  lidocaine (PF) (XYLOCAINE) 1 % injection 10 mL (10 mLs Infiltration Given by Other 02/06/21 4193)  bacitracin ointment ( Topical Given 02/06/21 0149)     ____________________________________________   MDM / ED COURSE   35 year old male presents with a toe laceration after accidental injury, requiring bedside suture repair, and subsequently amenable to outpatient management. Normal vitals on room air. Exam demonstrates isolated laceration to the plantar aspect of his right great toe over the proximal phalanx. Plain films without evidence of fracture or dislocation. Clinically, he looks well and has no evidence of pathology beyond isolated laceration to his right toe. Repaired, as above, with close approximation. Patient was placed in a postop shoe and advised to follow-up in 10-14 days for suture removal and wound check. We discussed close follow-up with his PCP and return precautions for the ED. Patient medically stable for outpatient management.  Clinical Course as of 02/06/21 0436  Thu Feb 06, 2021  0143 Lac repair complete [DS]    Clinical Course User Index [DS] Delton Prairie, MD    ____________________________________________   FINAL CLINICAL IMPRESSION(S) / ED DIAGNOSES  Final diagnoses:  Injury of right foot, initial encounter  Laceration of right great toe without foreign body present or damage to nail, initial encounter     ED Discharge Orders    None       Khi Mcmillen Katrinka Blazing   Note:  This document was prepared using Dragon voice recognition software and may include unintentional dictation errors.   Delton Prairie, MD 02/06/21 [phone]

## 2021-12-14 HISTORY — PX: FOOT FRACTURE SURGERY: SHX645

## 2022-05-19 ENCOUNTER — Emergency Department: Payer: Medicaid Other

## 2022-05-19 ENCOUNTER — Other Ambulatory Visit: Payer: Self-pay

## 2022-05-19 ENCOUNTER — Encounter: Payer: Self-pay | Admitting: Emergency Medicine

## 2022-05-19 ENCOUNTER — Emergency Department
Admission: EM | Admit: 2022-05-19 | Discharge: 2022-05-19 | Disposition: A | Discharge status: Home / Self Care | Payer: Medicaid Other | Attending: Emergency Medicine | Admitting: Emergency Medicine

## 2022-05-19 DIAGNOSIS — W1789XA Other fall from one level to another, initial encounter: Secondary | ICD-10-CM | POA: Insufficient documentation

## 2022-05-19 DIAGNOSIS — Y9372 Activity, wrestling: Secondary | ICD-10-CM | POA: Insufficient documentation

## 2022-05-19 DIAGNOSIS — E119 Type 2 diabetes mellitus without complications: Secondary | ICD-10-CM | POA: Insufficient documentation

## 2022-05-19 DIAGNOSIS — S92352A Displaced fracture of fifth metatarsal bone, left foot, initial encounter for closed fracture: Secondary | ICD-10-CM | POA: Diagnosis not present

## 2022-05-19 DIAGNOSIS — S99922A Unspecified injury of left foot, initial encounter: Secondary | ICD-10-CM | POA: Diagnosis present

## 2022-05-19 MED ORDER — OXYCODONE-ACETAMINOPHEN 5-325 MG PO TABS: 1.0000 | ORAL_TABLET | Freq: Four times a day (QID) | ORAL | 0 refills | Status: AC | PRN

## 2022-05-19 MED ORDER — OXYCODONE-ACETAMINOPHEN 5-325 MG PO TABS
1.0000 | ORAL_TABLET | Freq: Once | ORAL | Status: AC
  Administered 2022-05-19: 1 via ORAL
  Filled 2022-05-19: qty 1

## 2022-05-19 NOTE — ED Provider Notes (Signed)
[facility] Provider Note    Event Date/Time   First MD Initiated Contact with Patient 05/19/22 1355     (approximate)   History   Chief Complaint Foot Injury   HPI  Angel Chandler is a 36 y.o. male with past medical history of hyperlipidemia diabetes who presents to the ED complaining of foot injury.  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  He reports immediate onset of pain in the foot and has had pain when ambulating on it since then.  Over the past couple of days he has noticed increased swelling and bruising on the foot, not alleviated by ice and ibuprofen.  He continues to bear weight on the foot but states it is painful.  He denies any pain in the ankle or knee.     Physical Exam   Triage Vital Signs: ED Triage Vitals  Enc Vitals Group     BP 05/19/22 1323 128/86     Pulse Rate 05/19/22 1323 76     Resp 05/19/22 1323 17     Temp 05/19/22 1323 98.4 F (36.9 C)     Temp Source 05/19/22 1323 Oral     SpO2 05/19/22 1323 95 %     Weight 05/19/22 1255 (!) 384 lb 14.8 oz (174.6 kg)     Height 05/19/22 1255 6\' 5"  (1.956 m)     Head Circumference --      Peak Flow --      Pain Score 05/19/22 1255 8     Pain Loc --      Pain Edu? --      Excl. in GC? --     Most recent vital signs: Vitals:   05/19/22 1323  BP: 128/86  Pulse: 76  Resp: 17  Temp: 98.4 F (36.9 C)  SpO2: 95%    Constitutional: Alert and oriented. Eyes: Conjunctivae are normal. Head: Atraumatic. Nose: No congestion/rhinnorhea. Mouth/Throat: Mucous membranes are moist.  Cardiovascular: Normal rate, regular rhythm. Grossly normal heart sounds.  2+ radial and DP pulses bilaterally. Respiratory: Normal respiratory effort.  No retractions. Lungs CTAB. Gastrointestinal: Soft and nontender. No distention. Musculoskeletal: Ecchymosis and edema noted to the dorsum of left  foot with tenderness to palpation over the lateral portion of the foot.  No tenderness to palpation at the medial or lateral malleolus, no tenderness to palpation over medial foot. Neurologic:  Normal speech and language. No gross focal neurologic deficits are appreciated.    ED Results / Procedures / Treatments   Labs (all labs ordered are listed, but only abnormal results are displayed) Labs Reviewed - No data to display  RADIOLOGY X-ray of left foot reviewed and interpreted by me with oblique fracture at the midshaft of left metatarsal.  PROCEDURES:  Critical Care performed: No  Procedures   MEDICATIONS ORDERED IN ED: Medications  oxyCODONE-acetaminophen (PERCOCET/ROXICET) 5-325 MG per tablet 1 tablet (1 tablet Oral Given 05/19/22 1429)     IMPRESSION / MDM / ASSESSMENT AND PLAN / ED COURSE  I reviewed the triage vital signs and the nursing notes.                              36 y.o. male with past medical history of hyperlipidemia and diabetes who presents to the ED complaining of pain and  swelling primarily over the lateral portion of his left foot after he landed on the side of his foot 3 nights ago.  Patient's presentation is most consistent with acute complicated illness / injury requiring diagnostic workup.  Differential diagnosis includes, but is not limited to, metatarsal fracture, Lisfranc injury, Jones fracture, ankle sprain, ankle fracture.   Patient well-appearing and in no acute distress, vital signs are unremarkable and he is neurovascular intact to his distal left lower extremity.  X-rays of left foot reviewed and interpreted by me with oblique displaced fracture at the midshaft of the left fifth metatarsal, no evidence of Lisfranc injury.  Doubt ankle injury given lack of tenderness at the medial and lateral malleolus.  Patient placed in short leg splint and provided with crutches, counseled to keep the leg nonweightbearing until he is able to follow-up with  podiatry.  He will be prescribed a short course of pain medication and counseled to return to the ED for new worsening symptoms, patient agrees with plan.      FINAL CLINICAL IMPRESSION(S) / ED DIAGNOSES   Final diagnoses:  Closed displaced fracture of fifth metatarsal bone of left foot, initial encounter     Rx / DC Orders   ED Discharge Orders          Ordered    oxyCODONE-acetaminophen (PERCOCET) 5-325 MG tablet  Every 6 hours PRN        05/19/22 1511             Note:  This document was prepared using Dragon voice recognition software and may include unintentional dictation errors.   Chesley Noon, MD 05/19/22 1520

## 2022-05-19 NOTE — ED Triage Notes (Signed)
Injured left foot Saturday night.  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  Ambulatory.

## 2022-05-19 NOTE — ED Notes (Signed)
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

## 2022-05-20 ENCOUNTER — Other Ambulatory Visit: Payer: Self-pay | Admitting: Podiatry

## 2022-05-20 ENCOUNTER — Ambulatory Visit (INDEPENDENT_AMBULATORY_CARE_PROVIDER_SITE_OTHER): Payer: Medicaid Other

## 2022-05-20 ENCOUNTER — Ambulatory Visit: Payer: Medicaid Other | Admitting: Podiatry

## 2022-05-20 ENCOUNTER — Encounter: Payer: Self-pay | Admitting: Podiatry

## 2022-05-20 DIAGNOSIS — S92355A Nondisplaced fracture of fifth metatarsal bone, left foot, initial encounter for closed fracture: Secondary | ICD-10-CM

## 2022-05-20 NOTE — Progress Notes (Signed)
Subjective:  Patient ID: Angel Chandler, male    DOB: Nov 28, 1986,  MRN: [id number] HPI Chief Complaint  Patient presents with   Foot Injury    Lateral foot left - DOI: 05/16/22 - fell on Saturday, has walked on it until yesterday when he went to ED-xrayed-fracture 5th met, splinted and advised non-weight bearing and to follow up here, swollen and bruised   New Patient (Initial Visit)    Est pt 2021    36 y.o. male presents with the above complaint.   ROS: Denies fever chills nausea vomiting muscle aches pains calf pain back pain chest pain shortness of breath.  Past Medical History:  Diagnosis Date   Diabetes mellitus without complication ([place])    Type 2   Past Surgical History:  Procedure Laterality Date   SHOULDER SURGERY Left    TONSILLECTOMY      Current Outpatient Medications:    acetaminophen (TYLENOL) 500 MG tablet, Take by mouth., Disp: , Rfl:    buPROPion (WELLBUTRIN XL) 300 MG 24 hr tablet, Take by mouth., Disp: , Rfl:    canagliflozin (INVOKANA) 300 MG TABS tablet, Take by mouth., Disp: , Rfl:    [employer] ASPIRIN LOW DOSE 81 MG tablet, Take 81 mg by mouth daily., Disp: , Rfl:    Docusate Sodium (DSS) 100 MG CAPS, Take by mouth., Disp: , Rfl:    HYDROcodone-acetaminophen (NORCO/VICODIN) 5-325 MG tablet, Take 1-2 tabs by mouth every 4-6 hours as needed, Disp: , Rfl:    ibuprofen (ADVIL) 600 MG tablet, Take by mouth., Disp: , Rfl:    JARDIANCE 25 MG TABS tablet, Take 25 mg by mouth daily., Disp: , Rfl:    lisinopril (ZESTRIL) 5 MG tablet, Take by mouth., Disp: , Rfl:    meloxicam (MOBIC) 15 MG tablet, Take 1 tablet (15 mg total) by mouth daily., Disp: 30 tablet, Rfl: 0   oxyCODONE-acetaminophen (PERCOCET) 5-325 MG tablet, Take 1 tablet by mouth every 6 (six) hours as needed for severe pain., Disp: 12 tablet, Rfl: 0   rosuvastatin (CRESTOR) 40 MG tablet, Take 40 mg by mouth daily., Disp: , Rfl:    sitaGLIPtin-metformin (JANUMET) 50-1000 MG tablet, Take by mouth., Disp: , Rfl:     SPIRIVA HANDIHALER 18 MCG inhalation capsule, Place into inhaler and inhale daily., Disp: , Rfl:    VRAYLAR 3 MG capsule, Take 3 mg by mouth daily., Disp: , Rfl:   Allergies  Allergen Reactions   Codeine Nausea Only and Nausea And Vomiting   Review of Systems Objective:  There were no vitals filed for this visit.  General: Well developed, nourished, in no acute distress, alert and oriented x3   Dermatological: Skin is warm, dry and supple bilateral. Nails x 10 are well maintained; remaining integument appears unremarkable at this time. There are no open sores, no preulcerative lesions, no rash or signs of infection present.  Vascular: Dorsalis Pedis artery and Posterior Tibial artery pedal pulses are 2/4 bilateral with immedate capillary fill time. Pedal hair growth present. No varicosities and no lower extremity edema present bilateral.   Neruologic: Grossly intact via light touch bilateral. Vibratory intact via tuning fork bilateral. Protective threshold with Semmes Wienstein monofilament intact to all pedal sites bilateral. Patellar and Achilles deep tendon reflexes 2+ bilateral. No Babinski or clonus noted bilateral.   Musculoskeletal: No gross boney pedal deformities bilateral. No pain, crepitus, or limitation noted with foot and ankle range of motion bilateral. Muscular strength 5/5 in all groups tested bilateral.  Pain  swelling ecchymosis overlying the fifth metatarsal head left.  Appears to be in good alignment.  Gait: Unassisted, Nonantalgic.    Radiographs:  Radiographs were reviewed today from the those that were taken in urgent care we also took some at our office demonstrate an osseously mature individual with a oblique fracture and a dorsally dislocated fairly large fragment fifth metatarsal.  This is displaced but minimally comminuted.  Assessment & Plan:   Assessment: Fracture fifth metatarsal left foot.  Plan: Consented him for open reduction internal fixation fifth  metatarsal left foot with cast.  He understands this and is amenable to it understands the possible side effects complications and the duration of time that it takes for this to heal.  He understands these will have a cast on become nonweightbearing.  We did discuss some anesthesia the fact that he will have a block and that he will be numb for several hours.  He will receive his pain medication a few days prior to the procedure.  Follow-up with him at the time of surgery.  We are asking for medical clearance from his primary care provider just to make sure that his hemoglobin A1c is in good standing.     Lasonja Lakins T. [place], [place]

## 2022-05-21 ENCOUNTER — Encounter: Payer: Self-pay | Admitting: Podiatry

## 2022-05-26 ENCOUNTER — Telehealth: Payer: Self-pay | Admitting: Urology

## 2022-05-26 NOTE — Telephone Encounter (Signed)
DOS - 05/29/22  ORIF 5TH LEFT --- [address]  [facility] MEDICAID EFFECTIVE DATE - 05/14/22   RECEIVED FAX FROM [facility] MEDICAID STATING THAT CPT CODE 35597 HAS BEEN APPROVED, AUTH # [id number], GOOD FROM 05/29/22 - 07/28/22.

## 2022-05-28 ENCOUNTER — Other Ambulatory Visit: Payer: Self-pay | Admitting: Podiatry

## 2022-05-28 MED ORDER — OXYCODONE-ACETAMINOPHEN 10-325 MG PO TABS: 1.0000 | ORAL_TABLET | Freq: Three times a day (TID) | ORAL | 0 refills | Status: AC | PRN

## 2022-05-28 MED ORDER — CEPHALEXIN 500 MG PO CAPS: 500.0000 mg | ORAL_CAPSULE | Freq: Three times a day (TID) | ORAL | 0 refills | Status: DC

## 2022-05-28 MED ORDER — ONDANSETRON HCL 4 MG PO TABS: 4.0000 mg | ORAL_TABLET | Freq: Three times a day (TID) | ORAL | 0 refills | Status: DC | PRN

## 2022-05-29 DIAGNOSIS — S92355A Nondisplaced fracture of fifth metatarsal bone, left foot, initial encounter for closed fracture: Secondary | ICD-10-CM | POA: Diagnosis not present

## 2022-06-03 ENCOUNTER — Ambulatory Visit (INDEPENDENT_AMBULATORY_CARE_PROVIDER_SITE_OTHER): Payer: Medicaid Other | Admitting: Podiatry

## 2022-06-03 ENCOUNTER — Encounter: Payer: Self-pay | Admitting: Podiatry

## 2022-06-03 ENCOUNTER — Ambulatory Visit (INDEPENDENT_AMBULATORY_CARE_PROVIDER_SITE_OTHER): Payer: Medicaid Other

## 2022-06-03 VITALS — BP 143/80 | HR 73 | Temp 97.6°F

## 2022-06-03 DIAGNOSIS — S92355D Nondisplaced fracture of fifth metatarsal bone, left foot, subsequent encounter for fracture with routine healing: Secondary | ICD-10-CM

## 2022-06-03 DIAGNOSIS — Z9889 Other specified postprocedural states: Secondary | ICD-10-CM

## 2022-06-03 MED ORDER — VITAMIN D (ERGOCALCIFEROL) 1.25 MG (50000 UNIT) PO CAPS: 50000.0000 [IU] | ORAL_CAPSULE | ORAL | 0 refills | Status: AC

## 2022-06-03 NOTE — Progress Notes (Signed)
Angel Chandler presents today date of surgery is 05/29/2022 open reduction internal fixation fifth metatarsal left foot with cast application.  He states he has been doing okay but a little bit sore he denies fever chills nausea vomiting muscle aches pains calf pain back pain chest pain shortness of breath.  Objective: Presents today with a cast dry and clean slightly loosened proximally but good sensation and good range of motion of his toes distally.  Radiographs taken today demonstrate internal fixation 3 screws and 3 cerclage wires are intact.  Assessment: Well-healing surgical foot.  Plan: We will follow-up with him in 1 week for cast removal and reapplication.  I did encourage him to discontinue his daily dose of D3 and I put him on a 50,000 unit dose once a week for the next 5 to 6 weeks.

## 2022-06-10 ENCOUNTER — Ambulatory Visit (INDEPENDENT_AMBULATORY_CARE_PROVIDER_SITE_OTHER): Payer: Medicaid Other | Admitting: Podiatry

## 2022-06-10 ENCOUNTER — Encounter: Payer: Self-pay | Admitting: Podiatry

## 2022-06-10 DIAGNOSIS — S92355D Nondisplaced fracture of fifth metatarsal bone, left foot, subsequent encounter for fracture with routine healing: Secondary | ICD-10-CM | POA: Diagnosis not present

## 2022-06-10 DIAGNOSIS — Z9889 Other specified postprocedural states: Secondary | ICD-10-CM

## 2022-06-10 NOTE — Progress Notes (Signed)
He presents today with his fiance date of surgery 05/29/2022 open reduction internal fixation fifth metatarsal of the left foot and cast application.  He denies fever chills nausea vomiting muscle aches pains states he has been doing just fine continue nonweightbearing status utilizing his knee scooter.  Denies any shortness of breath.  He denies calf pain.  Objective: Cast is intact vital signs are stable.  Cast was removed demonstrates dry sterile dressing intact no blood no erythema no cellulitis drainage or odor.  Staples are intact margins are well coapted there appears to be some strangulation of some of the tissue distally along the incision but I think this will be fine.  Assessment well-healing surgical foot.  Plan: Redressed him today dressed a compressive dressing placed him back in a below-knee cast with minimal plantarflexion.

## 2022-06-24 ENCOUNTER — Encounter: Payer: Medicaid Other | Admitting: Podiatry

## 2022-07-01 ENCOUNTER — Encounter: Payer: Medicaid Other | Admitting: Podiatry

## 2022-07-01 ENCOUNTER — Ambulatory Visit (INDEPENDENT_AMBULATORY_CARE_PROVIDER_SITE_OTHER): Payer: Medicaid Other | Admitting: Podiatry

## 2022-07-01 ENCOUNTER — Encounter: Payer: Self-pay | Admitting: Podiatry

## 2022-07-01 ENCOUNTER — Ambulatory Visit (INDEPENDENT_AMBULATORY_CARE_PROVIDER_SITE_OTHER): Payer: Medicaid Other

## 2022-07-01 DIAGNOSIS — S92355D Nondisplaced fracture of fifth metatarsal bone, left foot, subsequent encounter for fracture with routine healing: Secondary | ICD-10-CM

## 2022-07-01 DIAGNOSIS — Z9889 Other specified postprocedural states: Secondary | ICD-10-CM

## 2022-07-01 NOTE — Progress Notes (Signed)
He presents today for follow-up of his open reduction internal fixation fifth metatarsal fracture left foot.  States that he is doing much better.  He is ready to get out of the cast he has been doing very well nonweightbearing denies fever chills nausea vomiting muscle aches pains calf pain back pain chest pain shortness of breath.  Objective: Vital signs stable alert oriented x3 dry sterile dressing was intact once the cast was removed staples are intact sutures are intact.  Radiographs taken today demonstrate a healing fifth metatarsal with screws and cerclage wire.   Assessment: Well-healing fracture fifth met.  Plan: Placed him in a cam boot today nonweightbearing status and allow him to start washing this and cleaning it otherwise he is to continue to wear the boot as if it were a cast.

## 2022-07-08 ENCOUNTER — Encounter: Payer: Medicaid Other | Admitting: Podiatry

## 2022-07-15 ENCOUNTER — Ambulatory Visit (INDEPENDENT_AMBULATORY_CARE_PROVIDER_SITE_OTHER): Payer: Medicaid Other | Admitting: Podiatry

## 2022-07-15 ENCOUNTER — Encounter: Payer: Self-pay | Admitting: Podiatry

## 2022-07-15 ENCOUNTER — Ambulatory Visit (INDEPENDENT_AMBULATORY_CARE_PROVIDER_SITE_OTHER): Payer: Medicaid Other

## 2022-07-15 DIAGNOSIS — S92355D Nondisplaced fracture of fifth metatarsal bone, left foot, subsequent encounter for fracture with routine healing: Secondary | ICD-10-CM | POA: Diagnosis not present

## 2022-07-15 DIAGNOSIS — Z9889 Other specified postprocedural states: Secondary | ICD-10-CM

## 2022-07-15 NOTE — Progress Notes (Signed)
He presents today with his wife date of surgery 05/29/2022 open reduction internal fixation fifth metatarsal left foot with cast application.  Objective: Presents today nonweightbearing knee scooter.  Cam walker is clean and dry.  Foot swelling is gone down considerably still tender on palpation of the plantar aspect of the foot left.  He still has some dried scab to the dorsal aspect of the incision.  He has good range of motion of the fifth metatarsal phalangeal joint nontender.  Radiographs taken today demonstrate no displacement minimal bone to bone healing.  Assessment: Slow healing surgical foot open reduction internal fixation fifth left.  616.  Plan: Discussed etiology pathology and surgical therapies at this point he is going to continue nonweightbearing status and I will follow-up with him in 2 weeks for another set of x-rays

## 2022-07-29 ENCOUNTER — Encounter: Payer: Self-pay | Admitting: Podiatry

## 2022-07-29 ENCOUNTER — Ambulatory Visit (INDEPENDENT_AMBULATORY_CARE_PROVIDER_SITE_OTHER): Payer: Medicaid Other

## 2022-07-29 ENCOUNTER — Ambulatory Visit (INDEPENDENT_AMBULATORY_CARE_PROVIDER_SITE_OTHER): Payer: Medicaid Other | Admitting: Podiatry

## 2022-07-29 DIAGNOSIS — S92355D Nondisplaced fracture of fifth metatarsal bone, left foot, subsequent encounter for fracture with routine healing: Secondary | ICD-10-CM

## 2022-07-29 DIAGNOSIS — Z9889 Other specified postprocedural states: Secondary | ICD-10-CM

## 2022-07-29 NOTE — Progress Notes (Signed)
He presents today date of surgery 05/29/2022 status post open reduction internal fixation fifth metatarsal left with a cast.  He states he is doing really well and very happy with it thus far.  Continues to be nonweightbearing with his cam boot and his knee scooter.  Objective: Vital signs are stable he is alert and oriented x3 plantar aspect of the cam boot appears to be good and clean.  Once removed demonstrates some mild edema no erythema cellulitis drainage or odor still has a small eschar to the distalmost aspect of the incision site but appears to be doing much better.  Not nearly as tender as it was previously.  Radiographs taken today demonstrate a healing fracture site.  The slowest part to heal is the distal plantar most aspect.  Assessment healing fracture fifth metatarsal status post ORIF.  Plan: Unguinal allow him to start partial weightbearing for the next 2 weeks and then I will follow-up with him in 2 weeks for x-ray.  He will continue to perform partial weightbearing with the boot on.

## 2022-08-12 ENCOUNTER — Ambulatory Visit (INDEPENDENT_AMBULATORY_CARE_PROVIDER_SITE_OTHER): Payer: Medicaid Other | Admitting: Podiatry

## 2022-08-12 ENCOUNTER — Encounter: Payer: Self-pay | Admitting: Podiatry

## 2022-08-12 ENCOUNTER — Encounter: Payer: Medicaid Other | Admitting: Podiatry

## 2022-08-12 ENCOUNTER — Ambulatory Visit (INDEPENDENT_AMBULATORY_CARE_PROVIDER_SITE_OTHER): Payer: Medicaid Other

## 2022-08-12 DIAGNOSIS — Z9889 Other specified postprocedural states: Secondary | ICD-10-CM

## 2022-08-12 DIAGNOSIS — S92355D Nondisplaced fracture of fifth metatarsal bone, left foot, subsequent encounter for fracture with routine healing: Secondary | ICD-10-CM

## 2022-08-12 NOTE — Progress Notes (Signed)
He feels today for follow-up of his ORIF fifth metatarsal left foot.  States that is doing quite well no problems.  Continues to his partial weightbearing with a cane in his cam boot.  Date of surgery was 05/29/2022 left fifth metatarsal.  Objective: Vital signs are stable alert oriented x3 much decrease in edema no erythema cellulitis drainage or odor incision site is gone on to heal uneventfully.  He does have a very superficial eschar over the distalmost aspect of the incision but no purulence no malodor.  Radiographs taken today demonstrate well-healing open reduction internal fixation doing very well.  All of the internal fixation is intact.  The fracture site is healing very nicely.  Assessment: Well-healing surgical foot.  Plan: Follow-up with me in a few weeks we are going to allow him to start trying to wear regular shoe no bare feet.

## 2022-09-16 ENCOUNTER — Ambulatory Visit (INDEPENDENT_AMBULATORY_CARE_PROVIDER_SITE_OTHER): Payer: Medicaid Other

## 2022-09-16 ENCOUNTER — Ambulatory Visit (INDEPENDENT_AMBULATORY_CARE_PROVIDER_SITE_OTHER): Payer: Medicaid Other | Admitting: Podiatry

## 2022-09-16 DIAGNOSIS — S92355D Nondisplaced fracture of fifth metatarsal bone, left foot, subsequent encounter for fracture with routine healing: Secondary | ICD-10-CM

## 2022-09-16 DIAGNOSIS — Z9889 Other specified postprocedural states: Secondary | ICD-10-CM | POA: Diagnosis not present

## 2022-09-16 NOTE — Progress Notes (Signed)
He presents today date of surgery 05/29/2022 he is open reduction internal fixation fifth metatarsal of the left foot.  He states that he seems to be doing pretty well just walking around in his tennis shoe and using his cane.  States that they went to [place] and he did pretty well with the exception of the fact that he felt like he needed to use his cane some.  Objective: Vital signs stable he is alert and oriented x3 presents today with his tennis shoe with a relatively normal gait.  No pain on palpation to the surgical site.  Radiographs taken today demonstrate well-healing fracture on just a small area that has not healed yet.  But it is doing very well at this point.  Assessment: Diabetes mellitus with slow healing fifth met fracture open reduced.  Plan: I recommended that he lease give it another 4 to 6 weeks before returning to his activities such as wrestling and running.

## 2022-10-28 ENCOUNTER — Ambulatory Visit (INDEPENDENT_AMBULATORY_CARE_PROVIDER_SITE_OTHER): Payer: Medicaid Other

## 2022-10-28 ENCOUNTER — Ambulatory Visit (INDEPENDENT_AMBULATORY_CARE_PROVIDER_SITE_OTHER): Payer: Medicaid Other | Admitting: Podiatry

## 2022-10-28 ENCOUNTER — Encounter: Payer: Self-pay | Admitting: Podiatry

## 2022-10-28 DIAGNOSIS — S92355D Nondisplaced fracture of fifth metatarsal bone, left foot, subsequent encounter for fracture with routine healing: Secondary | ICD-10-CM

## 2022-10-28 DIAGNOSIS — Z9889 Other specified postprocedural states: Secondary | ICD-10-CM

## 2022-10-28 NOTE — Progress Notes (Signed)
He presents today for follow-up of his left fifth metatarsal fracture which we performed surgery on him back in June.  He states that he seems to be doing pretty well though still gets a little stiff at times and is tender when he first starts walking on it.  He states that the scar is sometimes tender.  Objective: Vital signs are stable he is alert and oriented x3 there is no erythema edema salines drainage odor the incision line is scarred down to deeper structures and resulting in some numbness and tingling along the dorsal lateral aspect of the left foot mostly in the sural distribution.  He has very little in the way of pain on palpation of the fifth metatarsal itself.  Radiographs taken today demonstrates near complete consolidation.  Internal fixation is in good position.  Assessment: Well-healing surgical foot.  Plan: Allow him to try to get back to his regular routine.  I did encourage massage therapy for the scar

## 2024-01-13 ENCOUNTER — Other Ambulatory Visit: Payer: Self-pay

## 2024-01-13 ENCOUNTER — Emergency Department
Admission: EM | Admit: 2024-01-13 | Discharge: 2024-01-13 | Disposition: A | Discharge status: Home / Self Care | Payer: Medicaid Other | Attending: Emergency Medicine | Admitting: Emergency Medicine

## 2024-01-13 DIAGNOSIS — Y990 Civilian activity done for income or pay: Secondary | ICD-10-CM | POA: Insufficient documentation

## 2024-01-13 DIAGNOSIS — M62830 Muscle spasm of back: Secondary | ICD-10-CM | POA: Diagnosis not present

## 2024-01-13 DIAGNOSIS — M549 Dorsalgia, unspecified: Secondary | ICD-10-CM | POA: Diagnosis present

## 2024-01-13 DIAGNOSIS — X500XXA Overexertion from strenuous movement or load, initial encounter: Secondary | ICD-10-CM | POA: Insufficient documentation

## 2024-01-13 LAB — URINALYSIS, ROUTINE W REFLEX MICROSCOPIC
Bacteria, UA: NONE SEEN
Bilirubin Urine: NEGATIVE
Glucose, UA: >=500 mg/dL — AB
Hgb urine dipstick: NEGATIVE
Ketones, ur: NEGATIVE mg/dL
Leukocytes,Ua: NEGATIVE
Nitrite: NEGATIVE
Protein, ur: 30 mg/dL — AB
RBC / HPF: 0 RBC/hpf (ref 0–5)
Specific Gravity, Urine: 1.041 — ABNORMAL HIGH (ref 1.005–1.030)
pH: 5 (ref 5.0–8.0)

## 2024-01-13 MED ORDER — CYCLOBENZAPRINE HCL 10 MG PO TABS: 10.0000 mg | ORAL_TABLET | Freq: Three times a day (TID) | ORAL | 0 refills | Status: DC | PRN

## 2024-01-13 NOTE — ED Triage Notes (Signed)
Pt states that his back started hurting at work yesterday, denies injury, pt reports a couple times felt twinges, denies issues voiding, states that the pain is less severe when he doesn't move, pt states that he is taking an antiinflammatory for his back for the past couple, pt states this pain is different than what he was prescribed the medication for

## 2024-01-13 NOTE — ED Provider Triage Note (Signed)
Emergency Medicine Provider Triage Evaluation Note  YASSIN SCALES , a 38 y.o. male  was evaluated in triage.  Pt complains of mid back pain with radiation to the right side.  No new injury.  Currently taking Voltaren for back pain several months.    Review of Systems  Positive: + kidney stone. Negative: No N, v.   Physical Exam  BP 120/85 (BP Location: Left Arm)   Pulse 76   Temp 98.4 F (36.9 C) (Oral)   Resp 16   Ht 6\' 5"  (1.956 m)   Wt (!) 190.5 kg   SpO2 97%   BMI 49.80 kg/m  Gen:   Awake, no distress    Resp:  Normal effort  MSK:   Moves extremities without difficulty, able to ambulate slowly.   Other:    Medical Decision Making  Medically screening exam initiated at 10:00 AM.  Appropriate orders placed.  EDIS HUISH was informed that the remainder of the evaluation will be completed by another provider, this initial triage assessment does not replace that evaluation, and the importance of remaining in the ED until their evaluation is complete.     Tommi Rumps, PA-C 01/13/24 1002

## 2024-01-13 NOTE — ED Provider Notes (Signed)
[facility] Provider Note    Event Date/Time   First MD Initiated Contact with Patient 01/13/24 1127     (approximate)   History   Back Pain   HPI  Angel Chandler is a 38 y.o. male with history of diabetes presents emergency department complaining of back pain.  Started hurting at work yesterday.  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  States that he has been taking anti-inflammatory that that he was given for his back pain back in the fall.  States it is different this time.  Anti-inflammatories not been helping.  No fever or chills.      Physical Exam   Triage Vital Signs: ED Triage Vitals  Encounter Vitals Group     BP 01/13/24 0959 120/85     Systolic BP Percentile --      Diastolic BP Percentile --      Pulse Rate 01/13/24 0959 76     Resp 01/13/24 0959 16     Temp 01/13/24 0959 98.4 F (36.9 C)     Temp Source 01/13/24 0959 Oral     SpO2 01/13/24 0959 97 %     Weight 01/13/24 1000 (!) 420 lb (190.5 kg)     Height 01/13/24 1000 6\' 5"  (1.956 m)     Head Circumference --      Peak Flow --      Pain Score 01/13/24 0959 6     Pain Loc --      Pain Education --      Exclude from Growth Chart --     Most recent vital signs: Vitals:   01/13/24 0959  BP: 120/85  Pulse: 76  Resp: 16  Temp: 98.4 F (36.9 C)  SpO2: 97%     General: Awake, no distress.   CV:  Good peripheral perfusion. regular rate and  rhythm Resp:  Normal effort.  Abd:  No distention.   Other:  Spine nontender, large spasm noted from the mid thoracic to the lumbar along with paravertebrals on the right, 5/5 strength in all extremities, neurovascular intact   ED Results / Procedures / Treatments   Labs (all labs ordered are listed, but only abnormal results are displayed) Labs Reviewed  URINALYSIS, ROUTINE W REFLEX MICROSCOPIC - Abnormal; Notable for the following components:      Result Value   Color,  Urine YELLOW (*)    APPearance CLEAR (*)    Specific Gravity, Urine 1.041 (*)    Glucose, UA >=500 (*)    Protein, ur 30 (*)    All other components within normal limits     EKG     RADIOLOGY     PROCEDURES:   Procedures Chief Complaint  Patient presents with   Back Pain      MEDICATIONS ORDERED IN ED: Medications - No data to display   IMPRESSION / MDM / ASSESSMENT AND PLAN / ED COURSE  I reviewed the triage vital signs and the nursing notes.                              Differential diagnosis includes, but is not limited to, back pain, muscle spasm, muscle strain  Patient's presentation is most consistent with acute illness / injury with system symptoms.   The patient appears very well.  He does move easily.  He also has  a trip planned to go to [place] tomorrow.  With the spasm noted of placed him on Flexeril.  Told him he cannot drive while taking this medication as it would make him very drowsy.  Discussed over-the-counter comfort measures.  He is to stop on their drive to [place] and get out and stretch his back several times.  Explained to him that sitting in a car for that long could make this worse.  Patient is in agreement treatment plan.  He was given a work note and discharged stable condition.      FINAL CLINICAL IMPRESSION(S) / ED DIAGNOSES   Final diagnoses:  Acute mid back pain  Muscle spasm of back     Rx / DC Orders   ED Discharge Orders          Ordered    cyclobenzaprine (FLEXERIL) 10 MG tablet  3 times daily PRN        01/13/24 1145             Note:  This document was prepared using Dragon voice recognition software and may include unintentional dictation errors.    Faythe Ghee, PA-C 01/13/24 1319    Corena Herter, MD 01/13/24 1537

## 2024-02-28 ENCOUNTER — Other Ambulatory Visit: Payer: Self-pay

## 2024-02-28 ENCOUNTER — Emergency Department

## 2024-02-28 ENCOUNTER — Emergency Department
Admission: EM | Admit: 2024-02-28 | Discharge: 2024-02-28 | Disposition: A | Discharge status: Home / Self Care | Attending: Emergency Medicine | Admitting: Emergency Medicine

## 2024-02-28 ENCOUNTER — Encounter: Payer: Self-pay | Admitting: Emergency Medicine

## 2024-02-28 DIAGNOSIS — X58XXXA Exposure to other specified factors, initial encounter: Secondary | ICD-10-CM | POA: Insufficient documentation

## 2024-02-28 DIAGNOSIS — E119 Type 2 diabetes mellitus without complications: Secondary | ICD-10-CM | POA: Insufficient documentation

## 2024-02-28 DIAGNOSIS — S29012A Strain of muscle and tendon of back wall of thorax, initial encounter: Secondary | ICD-10-CM | POA: Insufficient documentation

## 2024-02-28 DIAGNOSIS — M546 Pain in thoracic spine: Secondary | ICD-10-CM | POA: Diagnosis present

## 2024-02-28 DIAGNOSIS — S39012A Strain of muscle, fascia and tendon of lower back, initial encounter: Secondary | ICD-10-CM

## 2024-02-28 MED ORDER — TRAMADOL HCL 50 MG PO TABS: 50.0000 mg | ORAL_TABLET | Freq: Four times a day (QID) | ORAL | 0 refills | Status: AC | PRN

## 2024-02-28 MED ORDER — CYCLOBENZAPRINE HCL 10 MG PO TABS: 10.0000 mg | ORAL_TABLET | Freq: Three times a day (TID) | ORAL | 0 refills | Status: AC | PRN

## 2024-02-28 MED ORDER — KETOROLAC TROMETHAMINE 15 MG/ML IJ SOLN
15.0000 mg | Freq: Once | INTRAMUSCULAR | Status: AC
  Administered 2024-02-28: 15 mg via INTRAMUSCULAR
  Filled 2024-02-28: qty 1

## 2024-02-28 MED ORDER — CYCLOBENZAPRINE HCL 10 MG PO TABS
10.0000 mg | ORAL_TABLET | Freq: Once | ORAL | Status: AC
  Administered 2024-02-28: 10 mg via ORAL
  Filled 2024-02-28: qty 1

## 2024-02-28 NOTE — ED Triage Notes (Signed)
 Pt here with back pain since last night. Pt states pain is in the middle, denies radiation. 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 Pt ambulatory to triage.

## 2024-02-28 NOTE — ED Provider Notes (Signed)
 [facility] Provider Note    Event Date/Time   First MD Initiated Contact with Patient 02/28/24 1046     (approximate)   History   Back Pain   HPI  Angel Chandler is a 38 y.o. male with history of diabetes, hyperlipidemia, and elevated blood pressure presents emergency department complaining of back pain that started on Thursday but has worsened over the weekend.  Patient states its along the upper mid back in the middle and does not radiate but is more painful with movement.  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  No known injury with lifting etc.  No numbness or tingling.  No loss of bowel control, no urinary retention      Physical Exam   Triage Vital Signs: ED Triage Vitals [02/28/24 0934]  Encounter Vitals Group     BP 130/86     Systolic BP Percentile      Diastolic BP Percentile      Pulse Rate 93     Resp 17     Temp 98 F (36.7 C)     Temp Source Oral     SpO2 97 %     Weight (!) 419 lb 15.6 oz (190.5 kg)     Height 6\' 5"  (1.956 m)     Head Circumference      Peak Flow      Pain Score 8     Pain Loc      Pain Education      Exclude from Growth Chart     Most recent vital signs: Vitals:   02/28/24 0934  BP: 130/86  Pulse: 93  Resp: 17  Temp: 98 F (36.7 C)  SpO2: 97%     General: Awake, no distress.   CV:  Good peripheral perfusion. regular rate and  rhythm Resp:  Normal effort.  Abd:  No distention.   Other:  Tender along the mid thoracic spine, paravertebral muscles also tender, grips equal bilaterally, neurovascular intact   ED Results / Procedures / Treatments   Labs (all labs ordered are listed, but only abnormal results are displayed) Labs Reviewed - No data to display   EKG     RADIOLOGY X-ray of the T-spine    PROCEDURES:   Procedures Chief Complaint  Patient presents with   Back Pain      MEDICATIONS ORDERED IN ED: Medications  ketorolac (TORADOL) 15 MG/ML injection 15 mg  (15 mg Intramuscular Given 02/28/24 1120)  cyclobenzaprine (FLEXERIL) tablet 10 mg (10 mg Oral Given 02/28/24 1122)     IMPRESSION / MDM / ASSESSMENT AND PLAN / ED COURSE  I reviewed the triage vital signs and the nursing notes.                              Differential diagnosis includes, but is not limited to, contusion, strain, spasm, fracture  Patient's presentation is most consistent with acute illness / injury with system symptoms.   X-ray of thoracic spine  Patient given Toradol 15 mg IM, Flexeril 10 mg p.o. for discomfort   X-ray of C-spine independently reviewed interpreted by me as being negative for acute abnormality  Patient did have relief with the Toradol and Flexeril.  Did explain everything to them.  Went over over-the-counter measures.  Given prescription for Flexeril and tramadol.  Continue ibuprofen.  He is in agreement treatment plan.  Discharged stable condition  with a work note.   FINAL CLINICAL IMPRESSION(S) / ED DIAGNOSES   Final diagnoses:  Back strain, initial encounter     Rx / DC Orders   ED Discharge Orders          Ordered    cyclobenzaprine (FLEXERIL) 10 MG tablet  3 times daily PRN        02/28/24 1358    traMADol (ULTRAM) 50 MG tablet  Every 6 hours PRN        02/28/24 1358             Note:  This document was prepared using Dragon voice recognition software and may include unintentional dictation errors.    Faythe Ghee, PA-C 02/28/24 1400    Minna Antis, MD 02/28/24 [phone]

## 2024-02-28 NOTE — Discharge Instructions (Addendum)
 Do not drive while taking the tramadol or Flexeril.  Continue ibuprofen

## 2024-04-17 IMAGING — CR DG FOOT COMPLETE 3+V*L*
3 series · 3 of 3 positions shown · non-contrast
Comparison: No comparison studies available.

CLINICAL DATA: Status post fall with right foot pain and bruising.

EXAM:
LEFT FOOT - COMPLETE 3+ VIEW

[foot ap]
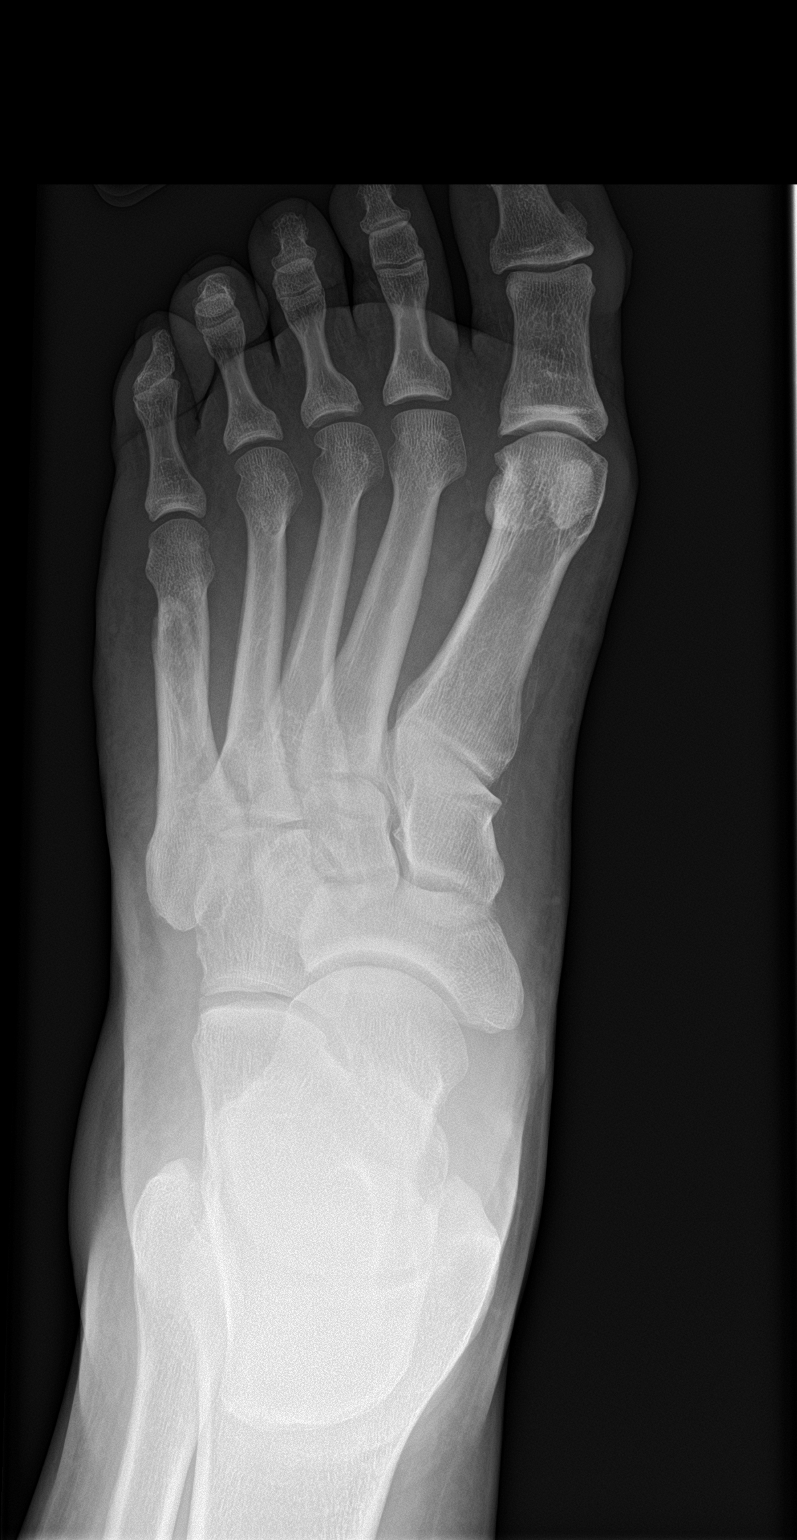

[foot obl]
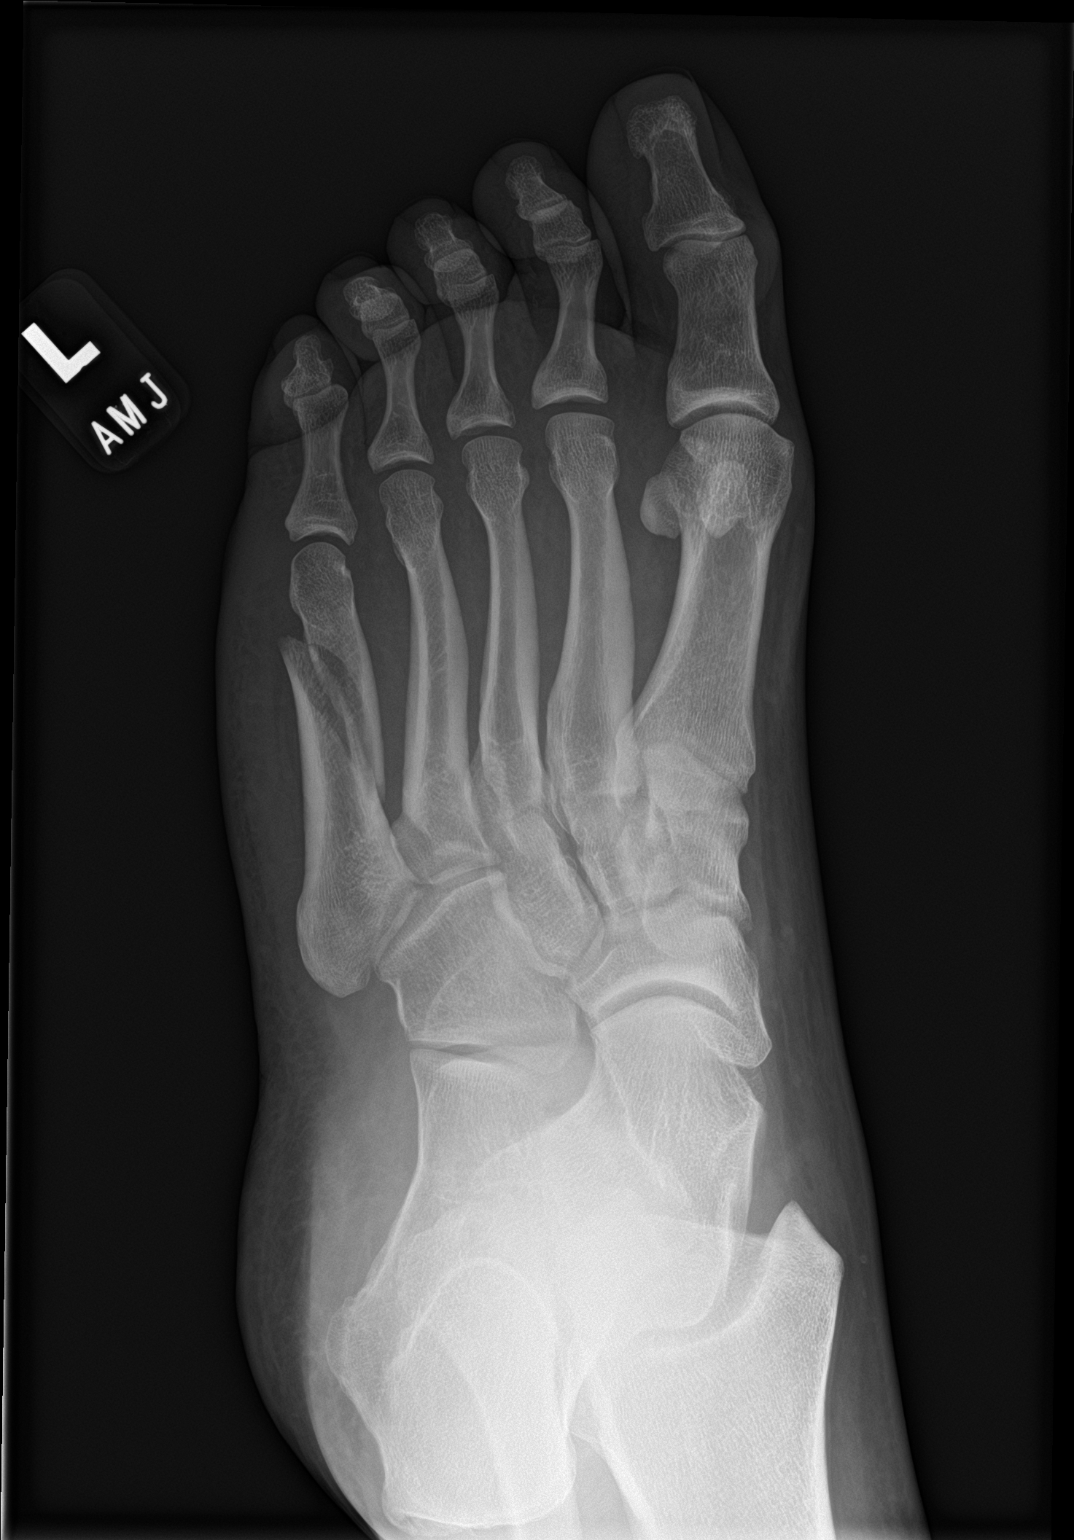

[foot lat]
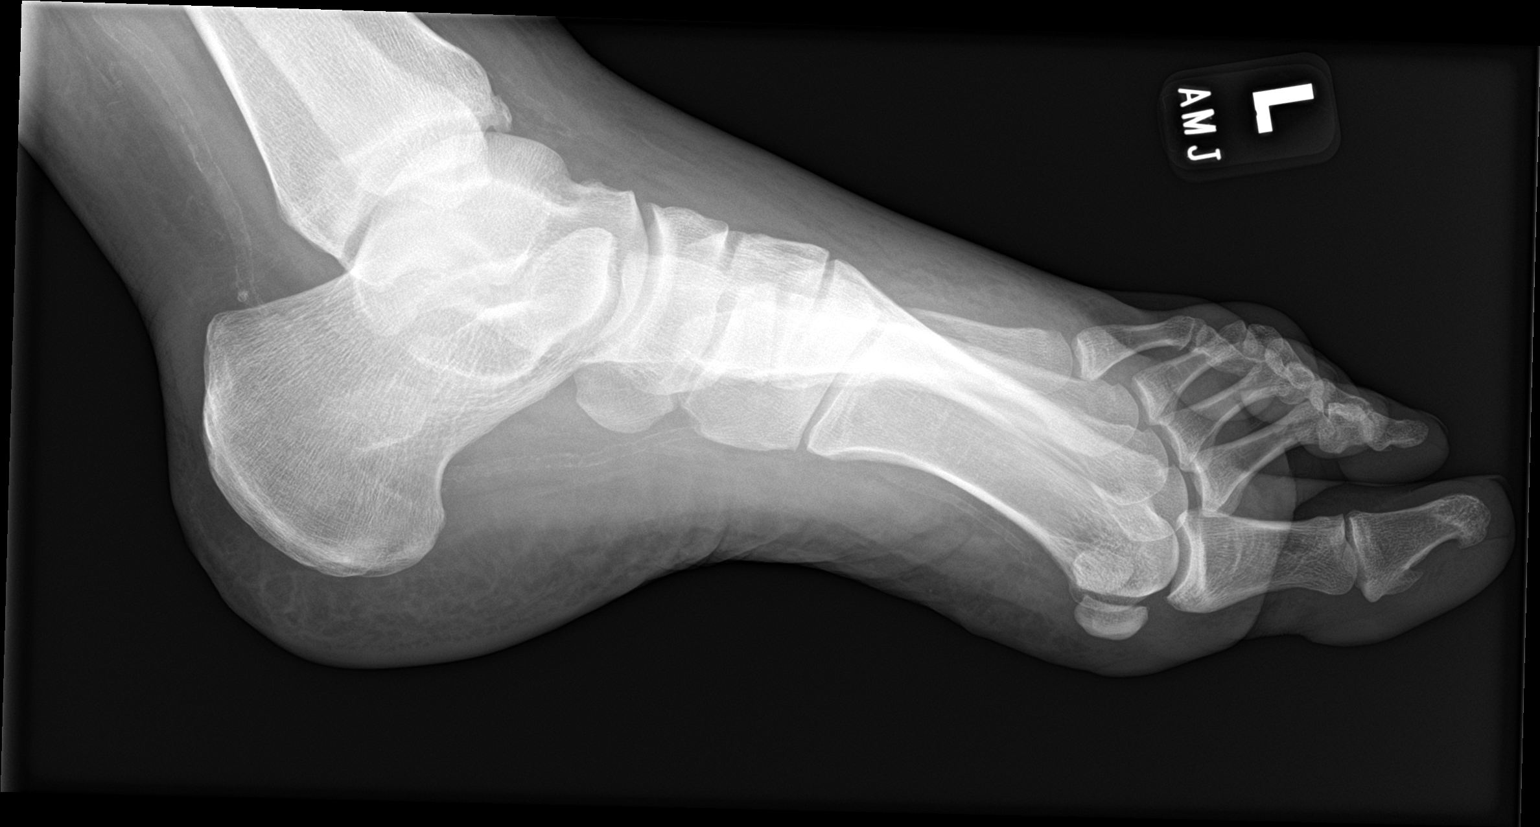

[3 of 3 positions shown; findings below may reference images not displayed]

FINDINGS: Oblique fracture noted mid shaft fifth metatarsal. Approximately [DATE]
shaft with medial displacement of the distal fragment relative to
the proximal fragment. No other acute fracture evident. No
dislocation.
IMPRESSION: Oblique fracture mid shaft fifth metatarsal as above.

## 2024-05-23 ENCOUNTER — Encounter: Payer: Self-pay | Admitting: Emergency Medicine

## 2024-05-23 ENCOUNTER — Emergency Department
Admission: EM | Admit: 2024-05-23 | Discharge: 2024-05-24 | Disposition: A | Discharge status: Home / Self Care | Attending: Emergency Medicine | Admitting: Emergency Medicine

## 2024-05-23 ENCOUNTER — Other Ambulatory Visit: Payer: Self-pay

## 2024-05-23 DIAGNOSIS — K2900 Acute gastritis without bleeding: Secondary | ICD-10-CM | POA: Insufficient documentation

## 2024-05-23 DIAGNOSIS — R1012 Left upper quadrant pain: Secondary | ICD-10-CM

## 2024-05-23 LAB — CBC WITH DIFFERENTIAL/PLATELET
Abs Immature Granulocytes: 0.04 10*3/uL (ref 0.00–0.07)
Basophils Absolute: 0.1 10*3/uL (ref 0.0–0.1)
Basophils Relative: 1 %
Eosinophils Absolute: 0.3 10*3/uL (ref 0.0–0.5)
Eosinophils Relative: 3 %
HCT: 48.3 % (ref 39.0–52.0)
Hemoglobin: 16.6 g/dL (ref 13.0–17.0)
Immature Granulocytes: 0 %
Lymphocytes Relative: 32 %
Lymphs Abs: 3.1 10*3/uL (ref 0.7–4.0)
MCH: 30.2 pg (ref 26.0–34.0)
MCHC: 34.4 g/dL (ref 30.0–36.0)
MCV: 87.8 fL (ref 80.0–100.0)
Monocytes Absolute: 0.8 10*3/uL (ref 0.1–1.0)
Monocytes Relative: 8 %
Neutro Abs: 5.5 10*3/uL (ref 1.7–7.7)
Neutrophils Relative %: 56 %
Platelets: 186 10*3/uL (ref 150–400)
RBC: 5.5 MIL/uL (ref 4.22–5.81)
RDW: 12.9 % (ref 11.5–15.5)
WBC: 9.8 10*3/uL (ref 4.0–10.5)
nRBC: 0 % (ref 0.0–0.2)

## 2024-05-23 LAB — URINALYSIS, ROUTINE W REFLEX MICROSCOPIC
Bacteria, UA: NONE SEEN
Bilirubin Urine: NEGATIVE
Glucose, UA: >=500 mg/dL — AB
Hgb urine dipstick: NEGATIVE
Ketones, ur: NEGATIVE mg/dL
Leukocytes,Ua: NEGATIVE
Nitrite: NEGATIVE
Protein, ur: 30 mg/dL — AB
Specific Gravity, Urine: 1.033 — ABNORMAL HIGH (ref 1.005–1.030)
Squamous Epithelial / HPF: 0 /HPF (ref 0–5)
pH: 7 (ref 5.0–8.0)

## 2024-05-23 LAB — COMPREHENSIVE METABOLIC PANEL WITH GFR
ALT: 44 U/L (ref 0–44)
AST: 29 U/L (ref 15–41)
Albumin: 4.3 g/dL (ref 3.5–5.0)
Alkaline Phosphatase: 44 U/L (ref 38–126)
Anion gap: 10 (ref 5–15)
BUN: 16 mg/dL (ref 6–20)
CO2: 24 mmol/L (ref 22–32)
Calcium: 9.5 mg/dL (ref 8.9–10.3)
Chloride: 104 mmol/L (ref 98–111)
Creatinine, Ser: 1.02 mg/dL (ref 0.61–1.24)
GFR, Estimated: >60 mL/min (ref >60)
Glucose, Bld: 175 mg/dL — ABNORMAL HIGH (ref 70–99)
Potassium: 3.8 mmol/L (ref 3.5–5.1)
Sodium: 138 mmol/L (ref 135–145)
Total Bilirubin: 1.1 mg/dL (ref 0.0–1.2)
Total Protein: 7.8 g/dL (ref 6.5–8.1)

## 2024-05-23 LAB — LIPASE, BLOOD: Lipase: 46 U/L (ref 11–51)

## 2024-05-23 NOTE — ED Triage Notes (Signed)
 Pt in via POV, reports worsening LUQ abdominal pain x approximately 1 month.  Over the last two weeks has experiencing some radiation through to back.  Reports just getting home from vacation today which is why he hasn't been seen sooner.  Denies N/V/D.  Ambulatory to triage, NAD noted at this time.

## 2024-05-23 NOTE — ED Provider Triage Note (Signed)
 Emergency Medicine Provider Triage Evaluation Note  Angel Chandler , a 38 y.o. male  was evaluated in triage.  Pt complains of LUQ abdominal pain that radiates to his back, ongoing for a month.  Review of Systems  Positive: LUQ abdominal pain Negative: N/v/d/c/fever  Physical Exam  BP (!) 150/91 (BP Location: Left Arm)   Pulse 86   Temp 98.4 F (36.9 C) (Oral)   Resp 20   Ht 6\' 5"  (1.956 m)   Wt (!) 181.4 kg   SpO2 97%   BMI 47.43 kg/m  Gen:   Awake, no distress   Resp:  Normal effort  MSK:   Moves extremities without difficulty  Other:    Medical Decision Making  Medically screening exam initiated at 8:01 PM.  Appropriate orders placed.  Angel Chandler was informed that the remainder of the evaluation will be completed by another provider, this initial triage assessment does not replace that evaluation, and the importance of remaining in the ED until their evaluation is complete.     Phyliss Breen, PA-C 05/23/24 2002

## 2024-05-23 NOTE — ED Provider Notes (Signed)
 [facility] Provider Note    Event Date/Time   First MD Initiated Contact with Patient 05/23/24 2312     (approximate)   History   Abdominal Pain   HPI  Angel Chandler is a 38 y.o. male who presents to the ED for evaluation of Abdominal Pain   Morbidly obese patient presents with his spouse for evaluation of chronic intermittent LUQ abdominal pressure.  Nearly every day, lasting seconds-minutes and self resolving.  No coexisting symptoms such as nausea, vomiting, shortness of breath, cough, dizziness, syncope.  No postprandial symptoms   Physical Exam   Triage Vital Signs: ED Triage Vitals  Encounter Vitals Group     BP 05/23/24 1950 (!) 150/91     Systolic BP Percentile --      Diastolic BP Percentile --      Pulse Rate 05/23/24 1950 86     Resp 05/23/24 1950 20     Temp 05/23/24 1950 98.4 F (36.9 C)     Temp Source 05/23/24 1950 Oral     SpO2 05/23/24 1950 97 %     Weight 05/23/24 1949 (!) 400 lb (181.4 kg)     Height 05/23/24 1949 6' 5 (1.956 m)     Head Circumference --      Peak Flow --      Pain Score 05/23/24 2003 2     Pain Loc --      Pain Education --      Exclude from Growth Chart --     Most recent vital signs: Vitals:   05/24/24 0100 05/24/24 0103  BP: 128/85   Pulse: 91   Resp: 18   Temp: 98.2 F (36.8 C)   SpO2: 98% 98%    General: Awake, no distress.  CV:  Good peripheral perfusion.  Resp:  Normal effort.  Abd:  No distention.  Minimal LUQ tenderness.  No RUQ tenderness.  Remainder of abdomen is benign MSK:  No deformity noted.  Neuro:  No focal deficits appreciated. Other:     ED Results / Procedures / Treatments   Labs (all labs ordered are listed, but only abnormal results are displayed) Labs Reviewed  COMPREHENSIVE METABOLIC PANEL WITH GFR - Abnormal; Notable for the following components:      Result Value   Glucose, Bld 175 (*)    All other components within normal limits  URINALYSIS, ROUTINE W  REFLEX MICROSCOPIC - Abnormal; Notable for the following components:   Color, Urine YELLOW (*)    APPearance CLEAR (*)    Specific Gravity, Urine 1.033 (*)    Glucose, UA >=500 (*)    Protein, ur 30 (*)    All other components within normal limits  LIPASE, BLOOD  CBC WITH DIFFERENTIAL/PLATELET    EKG Sinus rhythm with a rate of 80 bpm.  Normal axis and intervals.  No evidence of acute ischemia.  No comparison.  RADIOLOGY CT abdomen/pelvis interpreted by me without evidence of acute pathology  Official radiology report(s): CT ABDOMEN PELVIS WO CONTRAST Result Date: 05/24/2024 CLINICAL DATA:  Left-sided abdominal pain for 1 month EXAM: CT ABDOMEN AND PELVIS WITHOUT CONTRAST TECHNIQUE: Multidetector CT imaging of the abdomen and pelvis was performed following the standard protocol without IV contrast. RADIATION DOSE REDUCTION: This exam was performed according to the departmental dose-optimization program which includes automated exposure control, adjustment of the mA and/or kV according to patient size and/or use of iterative reconstruction technique. COMPARISON:  11/25/2012 FINDINGS: Lower chest: Minimal  right basilar atelectasis is noted. Tiny nodule is noted within the lingula best seen on image number 5 of series 4. No follow-up is recommended. Hepatobiliary: Liver is within normal limits. Multiple gallstones are seen without complicating factors. Pancreas: Unremarkable. No pancreatic ductal dilatation or surrounding inflammatory changes. Spleen: Normal in size without focal abnormality. Adrenals/Urinary Tract: Adrenal glands are within normal limits. Kidneys are well visualized bilaterally. No renal calculi or obstructive changes are seen. No bladder abnormality is noted. Stomach/Bowel: No obstructive or inflammatory changes of the colon are seen. The appendix is within normal limits. Small bowel and stomach are unremarkable. Vascular/Lymphatic: No significant vascular findings are present. No  enlarged abdominal or pelvic lymph nodes. Reproductive: Prostate is unremarkable. Other: No abdominal wall hernia or abnormality. No abdominopelvic ascites. Musculoskeletal: No acute or significant osseous findings. IMPRESSION: No acute abnormality noted. Electronically Signed   By: Violeta Grey M.D.   On: 05/24/2024 00:57    PROCEDURES and INTERVENTIONS:  Procedures  Medications  alum & mag hydroxide-simeth (MAALOX/MYLANTA) 200-200-20 MG/5ML suspension 30 mL (30 mLs Oral Given 05/24/24 0101)  famotidine (PEPCID) tablet 20 mg (20 mg Oral Given 05/24/24 0101)  lidocaine  (XYLOCAINE ) 2 % viscous mouth solution 15 mL (15 mLs Mouth/Throat Given 05/24/24 0101)     IMPRESSION / MDM / ASSESSMENT AND PLAN / ED COURSE  I reviewed the triage vital signs and the nursing notes.  Differential diagnosis includes, but is not limited to, medication side effect, GERD or gastritis, splenic hematoma or infarct, pneumothorax or basilar pneumonia  {Patient presents with symptoms of an acute illness or injury that is potentially life-threatening.  Morbidly obese patient presents to the ED with chronic intermittent LUQ abdominal pain, possibly gastric in etiology, suitable for outpatient management.  Reassuring blood work with normal CBC, lipase, metabolic panel.  UA without infectious features.  CT, as above.  Started on H2 blocker, referred to GI.  Clinical Course as of 05/24/24 0114  Wed May 24, 2024  0113 Reassessed.  Patient reports feeling better.  We discussed reassuring CT results, GI follow-up, starting an H2 blocker and ED return precautions.  Answered questions.  Suitable for outpatient management. [DS]    Clinical Course User Index [DS] Arline Bennett, MD     FINAL CLINICAL IMPRESSION(S) / ED DIAGNOSES   Final diagnoses:  LUQ abdominal pain  Acute gastritis without hemorrhage, unspecified gastritis type     Rx / DC Orders   ED Discharge Orders          Ordered    famotidine (PEPCID) 20 MG  tablet  Daily        05/24/24 0112             Note:  This document was prepared using Dragon voice recognition software and may include unintentional dictation errors.   Arline Bennett, MD 05/24/24 [phone]

## 2024-05-24 ENCOUNTER — Emergency Department

## 2024-05-24 MED ORDER — FAMOTIDINE 20 MG PO TABS: 20.0000 mg | ORAL_TABLET | Freq: Every day | ORAL | 1 refills | Status: AC

## 2024-05-24 MED ORDER — ALUM & MAG HYDROXIDE-SIMETH 200-200-20 MG/5ML PO SUSP
30.0000 mL | Freq: Once | ORAL | Status: AC
  Administered 2024-05-24: 30 mL via ORAL
  Filled 2024-05-24: qty 30

## 2024-05-24 MED ORDER — LIDOCAINE VISCOUS HCL 2 % MT SOLN
15.0000 mL | Freq: Once | OROMUCOSAL | Status: AC
  Administered 2024-05-24: 15 mL via OROMUCOSAL
  Filled 2024-05-24: qty 15

## 2024-05-24 MED ORDER — FAMOTIDINE 20 MG PO TABS
20.0000 mg | ORAL_TABLET | Freq: Once | ORAL | Status: AC
  Administered 2024-05-24: 20 mg via ORAL
  Filled 2024-05-24: qty 1

## 2024-05-24 NOTE — ED Notes (Signed)
 Patient to CT at this time
